# Patient Record
Sex: Male | Born: 1975
Health system: Southern US, Community
[De-identification: ages and names within clinical notes are randomized; demographics above are authoritative.]

## PROBLEM LIST (undated history)

## (undated) DIAGNOSIS — I10 Essential (primary) hypertension: Secondary | ICD-10-CM

## (undated) DIAGNOSIS — E785 Hyperlipidemia, unspecified: Secondary | ICD-10-CM

## (undated) DIAGNOSIS — R011 Cardiac murmur, unspecified: Secondary | ICD-10-CM

## (undated) DIAGNOSIS — F419 Anxiety disorder, unspecified: Secondary | ICD-10-CM

## (undated) DIAGNOSIS — B019 Varicella without complication: Secondary | ICD-10-CM

## (undated) DIAGNOSIS — A770 Spotted fever due to Rickettsia rickettsii: Secondary | ICD-10-CM

## (undated) HISTORY — PX: TYMPANOSTOMY TUBE PLACEMENT: SHX32

## (undated) HISTORY — DX: Anxiety disorder, unspecified: F41.9

## (undated) HISTORY — DX: Hyperlipidemia, unspecified: E78.5

## (undated) HISTORY — DX: Spotted fever due to Rickettsia rickettsii: A77.0

## (undated) HISTORY — DX: Essential (primary) hypertension: I10

## (undated) HISTORY — DX: Cardiac murmur, unspecified: R01.1

## (undated) HISTORY — PX: NO PAST SURGERIES: SHX2092

## (undated) HISTORY — DX: Varicella without complication: B01.9

---

## 2001-04-12 ENCOUNTER — Emergency Department (HOSPITAL_COMMUNITY): Admission: EM | Admit: 2001-04-12 | Discharge: 2001-04-12 | Payer: Self-pay | Admitting: Emergency Medicine

## 2001-04-12 ENCOUNTER — Encounter: Payer: Self-pay | Admitting: Emergency Medicine

## 2008-09-08 ENCOUNTER — Emergency Department: Payer: Self-pay | Admitting: Emergency Medicine

## 2011-12-16 ENCOUNTER — Ambulatory Visit: Payer: Self-pay | Admitting: Orthopedic Surgery

## 2012-01-05 ENCOUNTER — Ambulatory Visit: Payer: Self-pay | Admitting: Pain Medicine

## 2012-07-06 ENCOUNTER — Ambulatory Visit: Payer: Self-pay | Admitting: Pain Medicine

## 2015-03-22 ENCOUNTER — Telehealth: Payer: Self-pay | Admitting: Family Medicine

## 2015-03-22 NOTE — Telephone Encounter (Signed)
Pt came in and dropped off new pt paperwork.. Placed in Dr. Geralynn Ochs box.. Pt appt is 03/28/15 @ 1:30

## 2015-03-25 NOTE — Telephone Encounter (Signed)
Was received and documented in chart.

## 2015-03-28 ENCOUNTER — Ambulatory Visit: Payer: Self-pay | Admitting: Family Medicine

## 2015-07-13 ENCOUNTER — Emergency Department: Payer: BLUE CROSS/BLUE SHIELD

## 2015-07-13 ENCOUNTER — Inpatient Hospital Stay
Admission: EM | Admit: 2015-07-13 | Discharge: 2015-07-14 | DRG: 603 | Disposition: A | Discharge status: Home / Self Care | Payer: BLUE CROSS/BLUE SHIELD | Attending: Internal Medicine | Admitting: Internal Medicine

## 2015-07-13 ENCOUNTER — Encounter: Payer: Self-pay | Admitting: Emergency Medicine

## 2015-07-13 DIAGNOSIS — S40861A Insect bite (nonvenomous) of right upper arm, initial encounter: Secondary | ICD-10-CM | POA: Diagnosis present

## 2015-07-13 DIAGNOSIS — L03113 Cellulitis of right upper limb: Secondary | ICD-10-CM | POA: Diagnosis present

## 2015-07-13 DIAGNOSIS — Z8249 Family history of ischemic heart disease and other diseases of the circulatory system: Secondary | ICD-10-CM | POA: Diagnosis not present

## 2015-07-13 DIAGNOSIS — W57XXXA Bitten or stung by nonvenomous insect and other nonvenomous arthropods, initial encounter: Secondary | ICD-10-CM | POA: Diagnosis present

## 2015-07-13 DIAGNOSIS — Z87891 Personal history of nicotine dependence: Secondary | ICD-10-CM | POA: Diagnosis not present

## 2015-07-13 DIAGNOSIS — L0390 Cellulitis, unspecified: Secondary | ICD-10-CM | POA: Diagnosis present

## 2015-07-13 LAB — CBC WITH DIFFERENTIAL/PLATELET
Basophils Absolute: 0 10*3/uL (ref 0–0.1)
Basophils Relative: 1 %
Eosinophils Absolute: 0.1 10*3/uL (ref 0–0.7)
Eosinophils Relative: 1 %
HCT: 43.8 % (ref 40.0–52.0)
Hemoglobin: 15.1 g/dL (ref 13.0–18.0)
Lymphocytes Relative: 13 %
Lymphs Abs: 1.1 10*3/uL (ref 1.0–3.6)
MCH: 30.6 pg (ref 26.0–34.0)
MCHC: 34.5 g/dL (ref 32.0–36.0)
MCV: 88.8 fL (ref 80.0–100.0)
Monocytes Absolute: 0.8 10*3/uL (ref 0.2–1.0)
Monocytes Relative: 10 %
Neutro Abs: 6.3 10*3/uL (ref 1.4–6.5)
Neutrophils Relative %: 75 %
Platelets: 179 10*3/uL (ref 150–440)
RBC: 4.93 MIL/uL (ref 4.40–5.90)
RDW: 13.4 % (ref 11.5–14.5)
WBC: 8.4 10*3/uL (ref 3.8–10.6)

## 2015-07-13 LAB — COMPREHENSIVE METABOLIC PANEL
ALT: 16 U/L — ABNORMAL LOW (ref 17–63)
AST: 17 U/L (ref 15–41)
Albumin: 4.1 g/dL (ref 3.5–5.0)
Alkaline Phosphatase: 58 U/L (ref 38–126)
Anion gap: 8 (ref 5–15)
BUN: 16 mg/dL (ref 6–20)
CO2: 25 mmol/L (ref 22–32)
Calcium: 8.9 mg/dL (ref 8.9–10.3)
Chloride: 102 mmol/L (ref 101–111)
Creatinine, Ser: 1.02 mg/dL (ref 0.61–1.24)
GFR calc Af Amer: >60 mL/min (ref >60)
GFR calc non Af Amer: >60 mL/min (ref >60)
Glucose, Bld: 104 mg/dL — ABNORMAL HIGH (ref 65–99)
Potassium: 4.1 mmol/L (ref 3.5–5.1)
Sodium: 135 mmol/L (ref 135–145)
Total Bilirubin: 0.7 mg/dL (ref 0.3–1.2)
Total Protein: 7.3 g/dL (ref 6.5–8.1)

## 2015-07-13 LAB — TROPONIN I: Troponin I: <0.03 ng/mL (ref <0.031)

## 2015-07-13 MED ORDER — ACETAMINOPHEN 650 MG RE SUPP: 650.0000 mg | Freq: Four times a day (QID) | RECTAL | Status: DC | PRN

## 2015-07-13 MED ORDER — ACETAMINOPHEN 325 MG PO TABS
650.0000 mg | ORAL_TABLET | Freq: Four times a day (QID) | ORAL | Status: DC | PRN
  Administered 2015-07-13 – 2015-07-14 (×3): 650 mg via ORAL
  Filled 2015-07-13 (×3): qty 2

## 2015-07-13 MED ORDER — HYDROCODONE-ACETAMINOPHEN 5-325 MG PO TABS: 1.0000 | ORAL_TABLET | ORAL | Status: DC | PRN

## 2015-07-13 MED ORDER — MORPHINE SULFATE (PF) 2 MG/ML IV SOLN: 1.0000 mg | INTRAVENOUS | Status: DC | PRN

## 2015-07-13 MED ORDER — ENOXAPARIN SODIUM 40 MG/0.4ML ~~LOC~~ SOLN: 40.0000 mg | SUBCUTANEOUS | Status: DC

## 2015-07-13 MED ORDER — CLINDAMYCIN PHOSPHATE 600 MG/50ML IV SOLN
600.0000 mg | Freq: Once | INTRAVENOUS | Status: AC
  Administered 2015-07-13: 600 mg via INTRAVENOUS
  Filled 2015-07-13: qty 50

## 2015-07-13 MED ORDER — DOXYCYCLINE HYCLATE 100 MG PO TABS
100.0000 mg | ORAL_TABLET | Freq: Two times a day (BID) | ORAL | Status: DC
  Administered 2015-07-13 – 2015-07-14 (×2): 100 mg via ORAL
  Filled 2015-07-13 (×2): qty 1

## 2015-07-13 MED ORDER — SODIUM CHLORIDE 0.9 % IV BOLUS (SEPSIS)
1000.0000 mL | Freq: Once | INTRAVENOUS | Status: AC
  Administered 2015-07-13: 1000 mL via INTRAVENOUS

## 2015-07-13 MED ORDER — ONDANSETRON HCL 4 MG/2ML IJ SOLN: 4.0000 mg | Freq: Four times a day (QID) | INTRAMUSCULAR | Status: DC | PRN

## 2015-07-13 MED ORDER — ONDANSETRON HCL 4 MG PO TABS: 4.0000 mg | ORAL_TABLET | Freq: Four times a day (QID) | ORAL | Status: DC | PRN

## 2015-07-13 MED ORDER — CLINDAMYCIN PHOSPHATE 600 MG/50ML IV SOLN
600.0000 mg | Freq: Three times a day (TID) | INTRAVENOUS | Status: DC
  Administered 2015-07-13 – 2015-07-14 (×3): 600 mg via INTRAVENOUS
  Filled 2015-07-13 (×4): qty 50

## 2015-07-13 MED ORDER — IBUPROFEN 600 MG PO TABS
600.0000 mg | ORAL_TABLET | Freq: Once | ORAL | Status: AC
  Administered 2015-07-13: 600 mg via ORAL
  Filled 2015-07-13: qty 1

## 2015-07-13 NOTE — ED Notes (Signed)
During triage states has R sided chest pain especially with inspiration.

## 2015-07-13 NOTE — ED Provider Notes (Signed)
[facility] Emergency Department Provider Note  ____________________________________________   I have reviewed the triage vital signs and the nursing notes.   HISTORY  Chief Complaint Fever    HPI Nathan Fitzgerald is a 40 y.o. 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 He states this area began to become red and inflamed, yesterday he had fevers. He was started on doxycycline yesterday. Patient states his fevers getting worse and he feels generally washed out. He has not had any vomiting he states he has tried to stay hydrated. He had a brief episode of chest discomfort earlier on that right sided tumor that is completely gone. He also plays of a lymph node in the right neck region. He denies any paralysis or significant headache he denies any sore throat dysuria or urinary frequency, he does state that he has had a significant amount of edema in that right arm.    History reviewed. No pertinent past medical history.  There are no active problems to display for this patient.   History reviewed. No pertinent past surgical history.  No current outpatient prescriptions on file.  Allergies Review of patient's allergies indicates no known allergies.  No family history on file.  Social History Social History  Substance Use Topics  . Smoking status: Former Research scientist (life sciences)  . Smokeless tobacco: None  . Alcohol Use: Yes    Review of Systems Constitutional: Positive fever/chills Eyes: No visual changes. ENT: No sore throat. No stiff neck no neck pain Cardiovascular: Denies chest pain. Respiratory: Denies shortness of breath. Gastrointestinal:   no vomiting.  No diarrhea.  No constipation. Genitourinary: Negative for dysuria. Musculoskeletal: Negative lower extremity swelling Skin: Positive for rash. Neurological: Negative for  headaches, focal weakness or numbness. 10-point ROS otherwise negative.  ____________________________________________   PHYSICAL EXAM:  VITAL SIGNS: ED Triage Vitals  Enc Vitals Group     BP 07/13/15 1628 144/82 mmHg     Pulse Rate 07/13/15 1628 81     Resp 07/13/15 1628 20     Temp 07/13/15 1628 102.1 F (38.9 C)     Temp Source 07/13/15 1628 Oral     SpO2 07/13/15 1628 99 %     Weight 07/13/15 1628 203 lb (92.08 kg)     Height 07/13/15 1628 6' (1.829 m)     Head Cir --      Peak Flow --      Pain Score 07/13/15 1629 8     Pain Loc --      Pain Edu? --      Excl. in [place]? --     Constitutional: Alert and oriented. Nontoxic but appears to not feel well Eyes: Conjunctivae are normal. PERRL. EOMI. Head: Atraumatic. Nose: No congestion/rhinnorhea. Mouth/Throat: Mucous membranes are moist.  Oropharynx non-erythematous. Neck: No stridor.   Nontender with no meningismus, there is a small lymph node noted on the right which is tender but not fluctuant or erythematous Cardiovascular: Normal rate, regular rhythm. Grossly normal heart sounds.  Good peripheral circulation. Respiratory: Normal respiratory effort.  No retractions. Lungs CTAB. Abdominal: Soft and nontender. No distention. No guarding no rebound Back:  There is no focal tenderness or step off there is no midline tenderness there are no lesions noted. there is no CVA tenderness Musculoskeletal: No lower extremity tenderness. No joint effusions, no DVT signs  strong distal pulses no edema Neurologic:  Normal speech and language. No gross focal neurologic deficits are appreciated.  Skin:  Very large area of erythema without any fluctuance noted to the right underarm which goes down past the elbow. Psychiatric: Mood and affect are normal. Speech and behavior are normal.  ____________________________________________   LABS (all labs ordered are listed, but only abnormal results are displayed)  Labs Reviewed  COMPREHENSIVE  METABOLIC PANEL - Abnormal; Notable for the following:    Glucose, Bld 104 (*)    ALT 16 (*)    All other components within normal limits  CULTURE, BLOOD (ROUTINE X 2)  CULTURE, BLOOD (ROUTINE X 2)  CBC WITH DIFFERENTIAL/PLATELET  TROPONIN I   ____________________________________________  EKG  I personally interpreted any EKGs ordered by me or triage Rate 66 bpm sinus rhythm no acute ST elevation or depression partial right bundle branch block noted otherwise unremarkable EKG ____________________________________________  RADIOLOGY  I reviewed any imaging ordered by me or triage that were performed during my shift and, if possible, patient and/or family made aware of any abnormal findings. ____________________________________________   PROCEDURES  Procedure(s) performed: None  Critical Care performed: None  ____________________________________________   INITIAL IMPRESSION / ASSESSMENT AND PLAN / ED COURSE  Pertinent labs & imaging results that were available during my care of the patient were reviewed by me and considered in my medical decision making (see chart for details).  Patient with significant cellulitic component to his arm at this time with no obvious abscess but significant high fevers despite antibiotics. We will start him on clindamycin for possible MRSA coverage. This could be a tickborne pathology and Doxy should be continued but I also feel that he needs more extensive coverage given his high fevers. Even though his white count is reassuring I do feel the patient might benefit from inpatient IV antibiotics and further observation. ____________________________________________   FINAL CLINICAL IMPRESSION(S) / ED DIAGNOSES  Final diagnoses:  None      This chart was dictated using voice recognition software.  Despite best efforts to proofread,  errors can occur which can change meaning.     Schuyler Amor, MD 07/13/15 [phone]

## 2015-07-13 NOTE — ED Notes (Signed)
Took tick off of back of R upper arm 4 days ago. Began fevers yesterday, T max 103.4 today. Took tylenol at 12 noon today. Body aches. R upper arm swollen and red. Went to [place] yesterday and began doxycycline. Symptoms are getting worse.

## 2015-07-13 NOTE — H&P (Signed)
[facility] [place] NAME: Nathan Fitzgerald    MR#:  [id number]  DATE OF BIRTH:  [birth year]  DATE OF ADMISSION:  07/13/2015  PRIMARY CARE PHYSICIAN: Coral Spikes, DO   REQUESTING/REFERRING PHYSICIAN:Dr Mcshane  CHIEF COMPLAINT:   Right UE cellulitis. Tick bite on right arm on tuesday HISTORY OF PRESENT ILLNESS:  Nathan Fitzgerald  is a 40 y.o. male with no PMH comes in the the Er with fever 102 and right arm redness since Tuesday after tick bite. Was started on doxycycline by urgent care.  Received IV Clinda in the ER. Will admit for IV abxs for cellulitis  PAST MEDICAL HISTORY:  History reviewed. No pertinent past medical history.  PAST SURGICAL HISTOIRY:  History reviewed. No pertinent past surgical history.  SOCIAL HISTORY:   Social History  Substance Use Topics  . Smoking status: Former Research scientist (life sciences)  . Smokeless tobacco: Not on file  . Alcohol Use: Yes    FAMILY HISTORY:  HTn  DRUG ALLERGIES:  No Known Allergies  REVIEW OF SYSTEMS:  Review of Systems  Constitutional: Negative for fever, chills and diaphoresis.  HENT: Negative for congestion, ear pain, hearing loss, nosebleeds and sore throat.   Eyes: Negative for blurred vision, double vision, photophobia and pain.  Respiratory: Negative for hemoptysis, sputum production, wheezing and stridor.   Cardiovascular: Negative for orthopnea, claudication and leg swelling.  Gastrointestinal: Negative for heartburn and abdominal pain.  Genitourinary: Negative for dysuria and frequency.  Musculoskeletal: Negative for back pain, joint pain and neck pain.  Skin: Positive for rash.  Neurological: Negative for tingling, sensory change, speech change, focal weakness, seizures and headaches.  Endo/Heme/Allergies: Does not bruise/bleed easily.  Psychiatric/Behavioral: Negative for suicidal ideas, memory loss and substance abuse. The patient is not nervous/anxious.   All other systems  reviewed and are negative.    MEDICATIONS AT HOME:   Prior to Admission medications   Medication Sig Start Date End Date Taking? Authorizing Provider  acetaminophen (TYLENOL) 500 MG tablet Take 500 mg by mouth every 6 (six) hours as needed for mild pain, moderate pain or fever.   Yes Historical Provider, MD  doxycycline (VIBRA-TABS) 100 MG tablet Take 100 mg by mouth 2 (two) times daily. Take for 10 days. 07/12/15  Yes Historical Provider, MD      VITAL SIGNS:  Blood pressure 122/72, pulse 64, temperature 102.1 F (38.9 C), temperature source Oral, resp. rate 18, height 6' (1.829 m), weight 92.08 kg (203 lb), SpO2 94 %.  PHYSICAL EXAMINATION:  GENERAL:  40 y.o.-year-old patient lying in the bed with no acute distress.  EYES: Pupils equal, round, reactive to light and accommodation. No scleral icterus. Extraocular muscles intact.  HEENT: Head atraumatic, normocephalic. Oropharynx and nasopharynx clear.  NECK:  Supple, no jugular venous distention. No thyroid enlargement, no tenderness.  LUNGS: Normal breath sounds bilaterally, no wheezing, rales,rhonchi or crepitation. No use of accessory muscles of respiration.  CARDIOVASCULAR: S1, S2 normal. No murmurs, rubs, or gallops.  ABDOMEN: Soft, nontender, nondistended. Bowel sounds present. No organomegaly or mass.  EXTREMITIES: No pedal edema, cyanosis, or clubbing. Right UE swelling and rash with redenss NEUROLOGIC: Cranial nerves II through XII are intact. Muscle strength 5/5 in all extremities. Sensation intact. Gait not checked.  PSYCHIATRIC: The patient is alert and oriented x 3.  SKIN: No obvious rash, lesion, or ulcer.   LABORATORY PANEL:   CBC  Recent Labs Lab 07/13/15 1650  WBC 8.4  HGB 15.1  HCT 43.8  PLT 179   ------------------------------------------------------------------------------------------------------------------  Chemistries   Recent Labs Lab 07/13/15 1650  NA 135  K 4.1  CL 102  CO2 25  GLUCOSE 104*   BUN 16  CREATININE 1.02  CALCIUM 8.9  AST 17  ALT 16*  ALKPHOS 58  BILITOT 0.7   ------------------------------------------------------------------------------------------------------------------  Cardiac Enzymes  Recent Labs Lab 07/13/15 1650  TROPONINI <0.03   ------------------------------------------------------------------------------------------------------------------  RADIOLOGY:  Dg Chest 2 View  07/13/2015  CLINICAL DATA:  Pain with inspiration. Fevers. Recent tick bite. Ex-smoker. EXAM: CHEST  2 VIEW COMPARISON:  None. FINDINGS: Midline trachea. No pleural effusion or pneumothorax. Diffuse peribronchial thickening. Clear lungs. IMPRESSION: 1.  No acute cardiopulmonary disease. 2. Mild peribronchial thickening which may relate to chronic bronchitis or smoking. Electronically Signed   By: Abigail Miyamoto M.D.   On: 07/13/2015 17:04   IMPRESSION AND PLAN:   Nathan Fitzgerald  is a 40 y.o. male with no PMH comes in the the Er with fever 102 and right arm redness since Tuesday after tick bite. Was started on doxycycline by urgent care.   1. Right UE cellulitis after tick bite IV clinda(to cover MRSA)  Cont doxycycline BC x 2  2. DVT prophylaxis Lovenox SQ   All the records are reviewed and case discussed with ED provider. Management plans discussed with the patient, family and they are in agreement.  CODE STATUS: full  TOTAL TIME TAKING CARE OF THIS PATIENT: 45 minutes.    Nathan Fitzgerald M.D on 07/13/2015 at 7:08 PM  Between 7am to 6pm - Pager - [phone]  After 6pm go to [email or web address] - password [email or web address] [facility]  Office  [phone]  CC: Primary care physician; Coral Spikes, DO

## 2015-07-14 NOTE — Progress Notes (Signed)
Patient ID: Nathan Fitzgerald, male   DOB: 08-Oct-1975, 40 y.o.   MRN: [id number] [name] [place] was admitted to the Hospital on 07/13/2015 and Discharged  07/14/2015 and should be excused from work/school   for 6 days starting 07/13/2015 , may return to work/school without any restrictions.  Loletha Grayer M.D on 07/14/2015,at 10:30 AM  [name] [place]

## 2015-07-14 NOTE — Discharge Summary (Signed)
Discharge summary [facility] at [place] NAME: Nathan Fitzgerald    MR#:  [id number]  DATE OF BIRTH:  1975-07-09  DATE OF ADMISSION:  07/13/2015 ADMITTING PHYSICIAN: Fritzi Mandes, MD  DATE OF DISCHARGE: 07/14/2015  1:48 PM  PRIMARY CARE PHYSICIAN: Coral Spikes, DO    ADMISSION DIAGNOSIS:  Cellulitis of right upper extremity [id number]  DISCHARGE DIAGNOSIS:  Active Problems:   Cellulitis   SECONDARY DIAGNOSIS:  History reviewed. No pertinent past medical history.  HOSPITAL COURSE:   1. Tick bite and cellulitis surrounding the area of the bite on the right inner arm. The patient was having fever at home. Erythema started as a spot and then spread through his inner arm. He states that the erythema has better than yesterday. The area is still warm but faded since yesterday. The patient does not want to stay in the hospital any further and wants to go home. I offered him to stay in the hospital 1 more night and he did not want to do so. I convinced him to stay for 1 more dose of IV antibiotic while here. He will go back on his oral doxycycline upon going home. Note for out of work written in computer  DISCHARGE CONDITIONS:   Satisfactory  CONSULTS OBTAINED:    DnoneRUG ALLERGIES:  No Known Allergies  DISCHARGE MEDICATIONS:   Discharge Medication List as of 07/14/2015 12:13 PM    CONTINUE these medications which have NOT CHANGED   Details  acetaminophen (TYLENOL) 500 MG tablet Take 500 mg by mouth every 6 (six) hours as needed for mild pain, moderate pain or fever., Until Discontinued, Historical Med    doxycycline (VIBRA-TABS) 100 MG tablet Take 100 mg by mouth 2 (two) times daily. Take for 10 days., Starting 07/12/2015, Until Discontinued, Historical Med         DISCHARGE INSTRUCTIONS:   Follow up PMD one week  If you experience worsening of your admission symptoms, develop shortness of breath, life threatening emergency, suicidal or  homicidal thoughts you must seek medical attention immediately by calling 911 or calling your MD immediately  if symptoms less severe.  You Must read complete instructions/literature along with all the possible adverse reactions/side effects for all the Medicines you take and that have been prescribed to you. Take any new Medicines after you have completely understood and accept all the possible adverse reactions/side effects.   Please note  You were cared for by a hospitalist during your hospital stay. If you have any questions about your discharge medications or the care you received while you were in the hospital after you are discharged, you can call the unit and asked to speak with the hospitalist on call if the hospitalist that took care of you is not available. Once you are discharged, your primary care physician will handle any further medical issues. Please note that NO REFILLS for any discharge medications will be authorized once you are discharged, as it is imperative that you return to your primary care physician (or establish a relationship with a primary care physician if you do not have one) for your aftercare needs so that they can reassess your need for medications and monitor your lab values.    Today   CHIEF COMPLAINT:   Chief Complaint  Patient presents with  . Fever    HISTORY OF PRESENT ILLNESS:  Savas Sugarman  is a 40 y.o. male Presents after fever. Had a tick bite and was started  on doxycycline on Friday. Surrounding erythema on the arm worsened in the meantime.   VITAL SIGNS:  Blood pressure 119/62, pulse 69, temperature 99.8 F (37.7 C), temperature source Oral, resp. rate 16, height 6' (1.829 m), weight 94.983 kg (209 lb 6.4 oz), SpO2 99 %.   PHYSICAL EXAMINATION:  GENERAL:  40 y.o.-year-old patient lying in the bed with no acute distress.  EYES: Pupils equal, round, reactive to light and accommodation. No scleral icterus. Extraocular muscles intact.  HEENT:  Head atraumatic, normocephalic. Oropharynx and nasopharynx clear. Positive lymphadenopathy anterior cervical chain on the right. NECK:  Supple, no jugular venous distention. No thyroid enlargement, no tenderness.  LUNGS: Normal breath sounds bilaterally, no wheezing, rales,rhonchi or crepitation. No use of accessory muscles of respiration.  CARDIOVASCULAR: S1, S2 normal. No murmurs, rubs, or gallops.  ABDOMEN: Soft, non-tender, non-distended. Bowel sounds present. No organomegaly or mass.  EXTREMITIES: No pedal edema, cyanosis, or clubbing.  NEUROLOGIC: Cranial nerves II through XII are intact. Muscle strength 5/5 in all extremities. Sensation intact. Gait not checked.  PSYCHIATRIC: The patient is alert and oriented x 3.  SKIN: Outlined cellulitis with pen on the inner right arm showed that the erythema has regressed and faded quite a bit still some erythema at the site of the bite. Skin still warm to the touch. No right axilla lymph nodes.    DATA REVIEW:   CBC  Recent Labs Lab 07/13/15 1650  WBC 8.4  HGB 15.1  HCT 43.8  PLT 179    Chemistries   Recent Labs Lab 07/13/15 1650  NA 135  K 4.1  CL 102  CO2 25  GLUCOSE 104*  BUN 16  CREATININE 1.02  CALCIUM 8.9  AST 17  ALT 16*  ALKPHOS 58  BILITOT 0.7    Cardiac Enzymes  Recent Labs Lab 07/13/15 1650  TROPONINI <0.03    Microbiology Results  Results for orders placed or performed during the hospital encounter of 07/13/15  Culture, blood (routine x 2)     Status: None (Preliminary result)   Collection Time: 07/13/15  6:29 PM  Result Value Ref Range Status   Specimen Description BLOOD LEFT ASSIST CONTROL  Final   Special Requests BOTTLES DRAWN AEROBIC AND ANAEROBIC 10 CC  Final   Culture NO GROWTH < 24 HOURS  Final   Report Status PENDING  Incomplete  Culture, blood (routine x 2)     Status: None (Preliminary result)   Collection Time: 07/13/15  6:29 PM  Result Value Ref Range Status   Specimen Description  BLOOD LEFT HAND  Final   Special Requests BOTTLES DRAWN AEROBIC AND ANAEROBIC 8 CC  Final   Culture NO GROWTH < 24 HOURS  Final   Report Status PENDING  Incomplete    RADIOLOGY:  Dg Chest 2 View  07/13/2015  CLINICAL DATA:  Pain with inspiration. Fevers. Recent tick bite. Ex-smoker. EXAM: CHEST  2 VIEW COMPARISON:  None. FINDINGS: Midline trachea. No pleural effusion or pneumothorax. Diffuse peribronchial thickening. Clear lungs. IMPRESSION: 1.  No acute cardiopulmonary disease. 2. Mild peribronchial thickening which may relate to chronic bronchitis or smoking. Electronically Signed   By: Abigail Miyamoto M.D.   On: 07/13/2015 17:04    Management plans discussed with the patient, family and they are in agreement.  CODE STATUS:     Code Status Orders        Start     Ordered   07/13/15 2012  Full code   Continuous  07/13/15 2011    Code Status History    Date Active Date Inactive Code Status Order ID Comments User Context   This patient has a current code status but no historical code status.      TOTAL TIME TAKING CARE OF THIS PATIENT: 32 minutes.    Loletha Grayer M.D on 07/14/2015 at 3:26 PM  Between 7am to 6pm - Pager - [phone]  After 6pm go to [email or web address] - Proofreader  [employer] [facility] Office  [phone]  CC: Primary care physician; Coral Spikes, DO

## 2015-07-14 NOTE — Discharge Instructions (Signed)

## 2015-07-18 ENCOUNTER — Encounter: Payer: Self-pay | Admitting: Family Medicine

## 2015-07-18 ENCOUNTER — Ambulatory Visit (INDEPENDENT_AMBULATORY_CARE_PROVIDER_SITE_OTHER): Payer: BLUE CROSS/BLUE SHIELD | Admitting: Family Medicine

## 2015-07-18 VITALS — BP 118/84 | HR 89 | Temp 100.7°F | Ht 72.0 in | Wt 198.1 lb

## 2015-07-18 DIAGNOSIS — R509 Fever, unspecified: Secondary | ICD-10-CM | POA: Diagnosis not present

## 2015-07-18 DIAGNOSIS — J029 Acute pharyngitis, unspecified: Secondary | ICD-10-CM

## 2015-07-18 LAB — POCT RAPID STREP A (OFFICE): Rapid Strep A Screen: NEGATIVE

## 2015-07-18 MED ORDER — CEFDINIR 300 MG PO CAPS: 300.0000 mg | ORAL_CAPSULE | Freq: Two times a day (BID) | ORAL | Status: DC

## 2015-07-18 MED ORDER — CEFTRIAXONE SODIUM 1 G IJ SOLR
1.0000 g | Freq: Once | INTRAMUSCULAR | Status: AC
  Administered 2015-07-18: 1 g via INTRAMUSCULAR

## 2015-07-18 NOTE — Patient Instructions (Signed)
Take the medication as prescribed.  Call/follow up if you worsen.  Take care  Dr. Lacinda Axon

## 2015-07-19 ENCOUNTER — Encounter: Payer: Self-pay | Admitting: Family Medicine

## 2015-07-19 ENCOUNTER — Telehealth: Payer: Self-pay | Admitting: Family Medicine

## 2015-07-19 DIAGNOSIS — R509 Fever, unspecified: Secondary | ICD-10-CM | POA: Insufficient documentation

## 2015-07-19 DIAGNOSIS — J029 Acute pharyngitis, unspecified: Secondary | ICD-10-CM | POA: Insufficient documentation

## 2015-07-19 NOTE — Assessment & Plan Note (Signed)
New problem. Concerning given recent tick bite and cardiac exam. Recent blood cultures were negative. Unclear if this is from tick borne illness or pharyngitis. Advised to continue doxycycline.

## 2015-07-19 NOTE — Assessment & Plan Note (Signed)
New acute problem. Rapid strep negative today. Sending for culture. Given persistent fever and severity of pharyngitis, treating with Omnicef. Patient requested medication to expedite improvement. Given the above, IM Rocephin was given today.

## 2015-07-19 NOTE — Telephone Encounter (Signed)
Nathan Fitzgerald said that  Dr. Lacinda Axon wanted to know how pt was doing today (07/19/15).  Pt is still running a high fever (102.6), and still has the chills, and sore throat.

## 2015-07-19 NOTE — Telephone Encounter (Signed)
Patient stated that fever is up and down. He feels okay when its at (100), but when it spikes he does not feel well. Right side of his throat is better the left side is still swollen.

## 2015-07-19 NOTE — Progress Notes (Signed)
Subjective:  Patient ID: Nathan Fitzgerald, male    DOB: Oct 15, 1975  Age: 40 y.o. MRN: [id number]  CC: Fever, ST  HPI Nathan Fitzgerald is a 40 y.o. male presents to the clinic today with the above complaints.  Patient was recently admitted on 5/27-5/28 for cellulitis of the right upper arm following a tick bite. Prior to getting admitted he was treated with doxycycline as an outpatient (went to urgent care). His cellulitis worsened prompting him to go to the hospital. He was treated in the hospital with IV clindamycin and was discharged home on doxycycline. Patient states that since he's been home he's had daily fever. He reports associated fatigue. As of Wednesday he developed severe sore throat and swollen lymph nodes in anterior neck. He endorses compliance with doxycycline. No other associated symptoms. Patient states that his sore throat is interfering with his ability to eat and drink. He states the Tylenol with no improvement. No other complaints at this time.  PMH, Surgical Hx, Family Hx, Social History reviewed and updated as below.  Past Medical History  Diagnosis Date  . Chicken pox    Past Surgical History  Procedure Laterality Date  . No past surgeries     Family History  Problem Relation Age of Onset  . Adopted: Yes  . Family history unknown: Yes   Social History  Substance Use Topics  . Smoking status: Former Research scientist (life sciences)  . Smokeless tobacco: Never Used  . Alcohol Use: 3.0 oz/week    5 Standard drinks or equivalent per week   Review of Systems  Constitutional: Positive for fever and fatigue.  HENT: Positive for sore throat.   Skin: Positive for rash.  All other systems reviewed and are negative.  Objective:   Today's Vitals: BP 118/84 mmHg  Pulse 89  Temp(Src) 100.7 F (38.2 C) (Oral)  Ht 6' (1.829 m)  Wt 198 lb 2 oz (89.869 kg)  BMI 26.86 kg/m2  SpO2 98%  Physical Exam  Constitutional: He is oriented to person, place, and time. He appears well-developed. No  distress.  HENT:  Head: Normocephalic and atraumatic.  Oropharynx with severe erythema. No exudate.  Eyes: Conjunctivae are normal.  Neck: Neck supple.  Anterior cervical lymphadenopathy with tenderness.  Cardiovascular: Normal rate and regular rhythm.   ? Gallop or split S2. Soft systolic murmur.  Pulmonary/Chest: Effort normal. He has no wheezes. He has no rales.  Abdominal: Soft. He exhibits no distension. There is no tenderness. There is no rebound and no guarding.  Musculoskeletal: Normal range of motion.  Neurological: He is alert and oriented to person, place, and time.  Skin:  Erythematous papular rash noted on the medial aspects of the upper arms. Area of previous tick bite appears well-healed and normal.  Psychiatric: He has a normal mood and affect.  Vitals reviewed.  Assessment & Plan:   Problem List Items Addressed This Visit    Acute pharyngitis    New acute problem. Rapid strep negative today. Sending for culture. Given persistent fever and severity of pharyngitis, treating with Omnicef. Patient requested medication to expedite improvement. Given the above, IM Rocephin was given today.      Relevant Orders   POCT rapid strep A (Completed)   Culture, Group A Strep   Fever, unspecified - Primary    New problem. Concerning given recent tick bite and cardiac exam. Recent blood cultures were negative. Unclear if this is from tick borne illness or pharyngitis. Advised to continue doxycycline.  Relevant Medications   cefTRIAXone (ROCEPHIN) injection 1 g (Completed)   Other Relevant Orders   POCT rapid strep A (Completed)   Culture, Group A Strep      Outpatient Encounter Prescriptions as of 07/18/2015  Medication Sig  . acetaminophen (TYLENOL) 500 MG tablet Take 500 mg by mouth every 6 (six) hours as needed for mild pain, moderate pain or fever.  . doxycycline (VIBRA-TABS) 100 MG tablet Take 100 mg by mouth 2 (two) times daily. Take for 10 days.  .  cefdinir (OMNICEF) 300 MG capsule Take 1 capsule (300 mg total) by mouth 2 (two) times daily.  . [EXPIRED] cefTRIAXone (ROCEPHIN) injection 1 g    No facility-administered encounter medications on file as of 07/18/2015.   Follow-up: PRN  Nathan Fitzgerald

## 2015-07-19 NOTE — Telephone Encounter (Signed)
Continue medication. If fever persists, he should go to the hospital for eval over the weekend.

## 2015-07-20 ENCOUNTER — Encounter: Payer: Self-pay | Admitting: Family Medicine

## 2015-07-20 LAB — CULTURE, GROUP A STREP: Organism ID, Bacteria: NORMAL

## 2015-07-21 ENCOUNTER — Ambulatory Visit
Admission: EM | Admit: 2015-07-21 | Discharge: 2015-07-21 | Disposition: A | Discharge status: Home / Self Care | Payer: BLUE CROSS/BLUE SHIELD | Attending: Family Medicine | Admitting: Family Medicine

## 2015-07-21 DIAGNOSIS — R509 Fever, unspecified: Secondary | ICD-10-CM | POA: Diagnosis not present

## 2015-07-21 LAB — URINALYSIS COMPLETE WITH MICROSCOPIC (ARMC ONLY)
Bacteria, UA: NONE SEEN
Glucose, UA: NEGATIVE mg/dL
Hgb urine dipstick: NEGATIVE
Leukocytes, UA: NEGATIVE
Nitrite: NEGATIVE
Protein, ur: 30 mg/dL — AB
RBC / HPF: NONE SEEN RBC/hpf (ref 0–5)
Specific Gravity, Urine: 1.02 (ref 1.005–1.030)
pH: 6 (ref 5.0–8.0)

## 2015-07-21 LAB — CBC WITH DIFFERENTIAL/PLATELET
Basophils Absolute: 0.1 10*3/uL (ref 0–0.1)
Basophils Relative: 1 %
Eosinophils Absolute: 0.2 10*3/uL (ref 0–0.7)
Eosinophils Relative: 1 %
HCT: 46.5 % (ref 40.0–52.0)
Hemoglobin: 15.6 g/dL (ref 13.0–18.0)
Lymphocytes Relative: 11 %
Lymphs Abs: 1.5 10*3/uL (ref 1.0–3.6)
MCH: 29.8 pg (ref 26.0–34.0)
MCHC: 33.6 g/dL (ref 32.0–36.0)
MCV: 88.7 fL (ref 80.0–100.0)
Monocytes Absolute: 1.1 10*3/uL — ABNORMAL HIGH (ref 0.2–1.0)
Monocytes Relative: 8 %
Neutro Abs: 10.8 10*3/uL — ABNORMAL HIGH (ref 1.4–6.5)
Neutrophils Relative %: 79 %
Platelets: 338 10*3/uL (ref 150–440)
RBC: 5.25 MIL/uL (ref 4.40–5.90)
RDW: 13.6 % (ref 11.5–14.5)
WBC: 13.6 10*3/uL — ABNORMAL HIGH (ref 3.8–10.6)

## 2015-07-21 LAB — MONONUCLEOSIS SCREEN: Mono Screen: NEGATIVE

## 2015-07-21 NOTE — Discharge Instructions (Signed)
Call Dr. Lacinda Axon first in the morning for an appointment. If unable to keep down fluids, unable to keep fever below 102, or significant pain or shortness of breath you need to go immediately to the emergency room  Fever, Adult A fever is an increase in the body's temperature. It is often defined as a temperature of 100 F (38C) or higher. Short mild or moderate fevers often have no long-term effects. They also often do not need treatment. Moderate or high fevers may make you feel uncomfortable. Sometimes, they can also be a sign of a serious illness or disease. The sweating that may happen with repeated fevers or fevers that last a while may also cause you to not have enough fluid in your body (dehydration). You can take your temperature with a thermometer to see if you have a fever. A measured temperature can change with:  Age.  Time of day.  Where the thermometer is placed:  Mouth (oral).  Rectum (rectal).  Ear (tympanic).  Underarm (axillary).  Forehead (temporal). HOME CARE Pay attention to any changes in your symptoms. Take these actions to help with your condition:  Take over-the-counter and prescription medicines only as told by your doctor. Follow the dosing instructions carefully.  If you were prescribed an antibiotic medicine, take it as told by your doctor. Do not stop taking the antibiotic even if you start to feel better.  Rest as needed.  Drink enough fluid to keep your pee (urine) clear or pale yellow.  Sponge yourself or bathe with room-temperature water as needed. This helps to lower your body temperature . Do not use ice water.  Do not wear too many blankets or heavy clothes. GET HELP IF:  You throw up (vomit).  You cannot eat or drink without throwing up.  You have watery poop (diarrhea).  It hurts when you pee.  Your symptoms do not get better with treatment.  You have new symptoms.  You feel very weak. GET HELP RIGHT AWAY IF:  You are short of  breath or have trouble breathing.  You are dizzy or you pass out (faint).  You feel confused.  You have signs of not having enough fluid in your body, such as:  A dry mouth.  Peeing less.  Looking pale.  You have very bad pain in your belly (abdomen).  You keep throwing up or having water poop.  You have a skin rash.  Your symptoms suddenly get worse.   This information is not intended to replace advice given to you by your health care provider. Make sure you discuss any questions you have with your health care provider.   Document Released: 11/12/2007 Document Revised: 10/24/2014 Document Reviewed: 03/29/2014 Elsevier Interactive Patient Education [employer].

## 2015-07-21 NOTE — ED Provider Notes (Signed)
CSN: [id number]     Arrival date & time 07/21/15  0818 History   First MD Initiated Contact with Patient 07/21/15 [phone]     Chief Complaint  Patient presents with  . Fever    Pt reports recurrent fevers and sore throat from tick bite/cellulitis. He is being treated with Doxycyline and was hospitalized for cellulitiis and received IV ABX there. Blood cultures were done. F/u with Dr. Lacinda Axon after discharge and had negative strep test done. "Just not improving"   HPI   Nathan Fitzgerald is a pleasant 40 y.o. male who presents with fever for past 2 weeks with his husband. He admits to tick bite in his right upper arm and back about 2 weeks ago. The following day he developed a red painful rash on his right upper arm. He said it was painful and hot to touch. He was seen and treated with doxycycline and just completed treatment. In the interim he was hospitalized for cellulitis. Blood cultures returned negative. He denies any further rash. A couple days ago he developed right anterior cervical nodes and sore throat and was seen by Dr. Lacinda Axon his PCP. He had a negative throat culture at that time. He is continue to have daily fevers. He is taking around-the-clock Tylenol 2 extra strength for Motrin every 4-6 hours. His temperature max was 101 on medications and 103 when he was hospitalized. He was treated with clindamycin in the hospital. 3 days ago he was started on Cefdinir & given IM rocephin.  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 He denies any abdominal pain or urinary symptoms.  He denies any known history of tickborne illness or mono.  He denies any history of HIV.  He has diaphoresis and night sweats.      Past Medical History  Diagnosis Date  . Chicken pox    Past Surgical History  Procedure Laterality Date  . No past surgeries     Family History  Problem Relation Age of Onset  . Adopted: Yes  . Family history unknown: Yes   Social History  Substance Use  Topics  . Smoking status: Former Research scientist (life sciences)  . Smokeless tobacco: Never Used  . Alcohol Use: 3.0 oz/week    5 Standard drinks or equivalent per week    Review of Systems  Constitutional: Positive for fever, chills, diaphoresis, activity change, appetite change and fatigue.  HENT: Positive for congestion, ear pain and sore throat. Negative for mouth sores, postnasal drip, rhinorrhea, sinus pressure, sneezing and trouble swallowing.   Eyes: Negative.   Respiratory: Negative.   Cardiovascular: Negative.   Gastrointestinal: Negative.   Genitourinary: Negative.   Musculoskeletal: Negative.   Skin: Negative.   Neurological: Negative.   Psychiatric/Behavioral: Negative.     Allergies  Review of patient's allergies indicates no known allergies.  Home Medications   Prior to Admission medications   Medication Sig Start Date End Date Taking? Authorizing Provider  acetaminophen (TYLENOL) 500 MG tablet Take 500 mg by mouth every 6 (six) hours as needed for mild pain, moderate pain or fever.    Historical Provider, MD  cefdinir (OMNICEF) 300 MG capsule Take 1 capsule (300 mg total) by mouth 2 (two) times daily. 07/18/15   Coral Spikes, DO  doxycycline (VIBRA-TABS) 100 MG tablet Take 100 mg by mouth 2 (two) times daily. Take for 10 days. 07/12/15   Historical Provider, MD   Meds Ordered and Administered this  Visit  Medications - No data to display  BP 129/77 mmHg  Pulse 88  Temp(Src) 98.6 F (37 C) (Oral)  Resp 18  Ht 6' (1.829 m)  Wt 193 lb (87.544 kg)  BMI 26.17 kg/m2  SpO2 100% No data found.   Physical Exam  Constitutional: He is oriented to person, place, and time. He appears well-developed and well-nourished. No distress.  HENT:  Head: Normocephalic and atraumatic.  Right Ear: Tympanic membrane, external ear and ear canal normal. Tympanic membrane is not injected, not erythematous and not bulging.  Left Ear: Hearing, tympanic membrane, external ear and ear canal normal. Tympanic  membrane is not injected, not erythematous and not bulging.  Nose: Nose normal. Right sinus exhibits no maxillary sinus tenderness and no frontal sinus tenderness. Left sinus exhibits no maxillary sinus tenderness and no frontal sinus tenderness.  Mouth/Throat: Uvula is midline and mucous membranes are normal. Posterior oropharyngeal erythema present. No oropharyngeal exudate, posterior oropharyngeal edema or tonsillar abscesses.  Mouth posterior pharyngeal and soft palate erythema with a few tiny petechiae  Eyes: Conjunctivae are normal. Right eye exhibits no discharge. Left eye exhibits no discharge. No scleral icterus.  Neck: Trachea normal and normal range of motion. Neck supple. No thyroid mass and no thyromegaly present.  Cardiovascular: Normal rate, regular rhythm and normal heart sounds.   Pulmonary/Chest: Effort normal and breath sounds normal. No respiratory distress.  Abdominal: Soft. Bowel sounds are normal. He exhibits no distension. There is no hepatosplenomegaly. There is no tenderness. There is no rebound.  Musculoskeletal: Normal range of motion. He exhibits no edema or tenderness.  Lymphadenopathy:    He has no cervical adenopathy.    He has no axillary adenopathy.  Neurological: He is alert and oriented to person, place, and time. No cranial nerve deficit.  Skin: Skin is warm. No rash noted. He is diaphoretic. No cyanosis or erythema. Nails show no clubbing.  Psychiatric: His behavior is normal. Judgment normal.  Nursing note and vitals reviewed.   ED Course  Procedures NA  Labs Review Labs Reviewed  CBC WITH DIFFERENTIAL/PLATELET - Abnormal; Notable for the following:    WBC 13.6 (*)    Neutro Abs 10.8 (*)    Monocytes Absolute 1.1 (*)    All other components within normal limits  URINALYSIS COMPLETEWITH MICROSCOPIC ([facility] ONLY) - Abnormal; Notable for the following:    Bilirubin Urine 1+ (*)    Ketones, ur TRACE (*)    Protein, ur 30 (*)    Squamous Epithelial /  LPF 0-5 (*)    All other components within normal limits  MONONUCLEOSIS SCREEN  ROCKY MTN SPOTTED FVR ABS PNL(IGG+IGM)  B. BURGDORFI ANTIBODIES  HIV ANTIBODY (ROUTINE TESTING)    Imaging Review No results found.  MDM   1. Recurrent fever of unknown cause    UA, CBC and mono tests reviewed with patient and husband. Etiology of fever unclear at this time. White blood cell count is 13,000. He was advised to follow with Dr. Lacinda Axon first thing in the morning or to go to emergency department for further evaluation if his symptoms worsen prior to then. We will contact him with follow-up labs  Discussed management with Dr Alveta Heimlich.  Plan: Test results and diagnosis reviewed with patient/caregiver  Continue tylenol & ibuprofen PRN Recommend supportive treatment with PUSH fluids & rest Continue omnicef     Andria Meuse, NP 07/21/15 1105

## 2015-07-22 ENCOUNTER — Encounter: Payer: Self-pay | Admitting: Family Medicine

## 2015-07-22 ENCOUNTER — Telehealth: Payer: Self-pay | Admitting: *Deleted

## 2015-07-22 NOTE — Telephone Encounter (Signed)
Please advise he was seen in the ED on 6/5, no indication of timeline for me to go off. thanks

## 2015-07-22 NOTE — Telephone Encounter (Signed)
Patient wanted to verify with Dr. Lacinda Axon when he should return to work.

## 2015-07-22 NOTE — Telephone Encounter (Signed)
FYI

## 2015-07-23 LAB — HIV ANTIBODY (ROUTINE TESTING W REFLEX): HIV Screen 4th Generation wRfx: NONREACTIVE

## 2015-07-23 NOTE — Telephone Encounter (Signed)
May return to work when he likes. I will write letter to reflect preference.

## 2015-07-23 NOTE — Telephone Encounter (Signed)
Letter completed and patient notified.

## 2015-07-23 NOTE — Telephone Encounter (Signed)
Patient would like a return to work note for Wednesday . Please call when note is ready for pick up  [phone]

## 2015-07-24 LAB — ROCKY MTN SPOTTED FVR ABS PNL(IGG+IGM)
RMSF IgG: NEGATIVE
RMSF IgM: 0.31 {index} (ref 0.00–0.89)

## 2015-07-24 LAB — B. BURGDORFI ANTIBODIES: B burgdorferi Ab IgG+IgM: <0.91 {ISR} (ref 0.00–0.90)

## 2015-07-25 LAB — CULTURE, BLOOD (ROUTINE X 2)
Culture: NO GROWTH
Culture: NO GROWTH

## 2015-11-06 ENCOUNTER — Encounter: Payer: Self-pay | Admitting: Family Medicine

## 2016-02-29 ENCOUNTER — Ambulatory Visit
Admission: EM | Admit: 2016-02-29 | Discharge: 2016-02-29 | Disposition: A | Discharge status: Home / Self Care | Payer: Commercial Managed Care - HMO | Attending: Emergency Medicine | Admitting: Emergency Medicine

## 2016-02-29 ENCOUNTER — Encounter: Payer: Self-pay | Admitting: Gynecology

## 2016-02-29 DIAGNOSIS — J101 Influenza due to other identified influenza virus with other respiratory manifestations: Secondary | ICD-10-CM

## 2016-02-29 LAB — RAPID INFLUENZA A&B ANTIGENS
Influenza A (ARMC): POSITIVE — AB
Influenza B (ARMC): NEGATIVE

## 2016-02-29 MED ORDER — OSELTAMIVIR PHOSPHATE 75 MG PO CAPS: 75.0000 mg | ORAL_CAPSULE | Freq: Two times a day (BID) | ORAL | 0 refills | Status: DC

## 2016-02-29 MED ORDER — CETIRIZINE-PSEUDOEPHEDRINE ER 5-120 MG PO TB12: 1.0000 | ORAL_TABLET | Freq: Two times a day (BID) | ORAL | 0 refills | Status: DC

## 2016-02-29 NOTE — ED Triage Notes (Signed)
Patient c/o headache and fever at home of 101.8 x today.

## 2016-02-29 NOTE — ED Provider Notes (Signed)
CSN: [id number]     Arrival date & time 02/29/16  0848 History   First MD Initiated Contact with Patient 02/29/16 [phone]     Chief Complaint  Patient presents with  . Headache  . Fever   (Consider location/radiation/quality/duration/timing/severity/associated sxs/prior Treatment) HPI  This a 41 year old male who started having headache and fever yesterday and subsequently left work early. This morning he awoke with the same symptoms and a fever of 101.8. He has not received his flu shot this year. He said he has very little cough and only slight congestion. Has had  diarrhea. He denies any sore throat or urinary symptoms. He had nausea yesterday afternoon.       Past Medical History:  Diagnosis Date  . Chicken pox    Past Surgical History:  Procedure Laterality Date  . NO PAST SURGERIES     Family History  Problem Relation Age of Onset  . Adopted: Yes  . Family history unknown: Yes   Social History  Substance Use Topics  . Smoking status: Former Research scientist (life sciences)  . Smokeless tobacco: Never Used  . Alcohol use 3.0 oz/week    5 Standard drinks or equivalent per week    Review of Systems  Constitutional: Positive for activity change, fatigue and fever. Negative for chills.  HENT: Positive for congestion and postnasal drip. Negative for sore throat.   Respiratory: Positive for cough. Negative for shortness of breath, wheezing and stridor.   All other systems reviewed and are negative.   Allergies  Patient has no known allergies.  Home Medications   Prior to Admission medications   Medication Sig Start Date End Date Taking? Authorizing Provider  acetaminophen (TYLENOL) 500 MG tablet Take 500 mg by mouth every 6 (six) hours as needed for mild pain, moderate pain or fever.   Yes Historical Provider, MD  cetirizine-pseudoephedrine (ZYRTEC-D) 5-120 MG tablet Take 1 tablet by mouth 2 (two) times daily. 02/29/16   [name], PA-C  oseltamivir (TAMIFLU) 75 MG capsule Take 1  capsule (75 mg total) by mouth every 12 (twelve) hours. 02/29/16   [name], PA-C   Meds Ordered and Administered this Visit  Medications - No data to display  BP 135/79 (BP Location: Left Arm)   Pulse 85   Temp (!) 102.1 F (38.9 C) (Oral)   Resp 16   Wt 205 lb (93 kg)   SpO2 98%   BMI 27.80 kg/m  No data found.   Physical Exam  Constitutional: He is oriented to person, place, and time. He appears well-developed and well-nourished. No distress.  HENT:  Head: Normocephalic and atraumatic.  Right Ear: External ear normal.  Left Ear: External ear normal.  Nose: Nose normal.  Mouth/Throat: Oropharynx is clear and moist. No oropharyngeal exudate.  Eyes: EOM are normal. Pupils are equal, round, and reactive to light. Right eye exhibits no discharge. Left eye exhibits no discharge.  Neck: Normal range of motion. Neck supple.  Pulmonary/Chest: Effort normal and breath sounds normal. No respiratory distress. He has no wheezes. He has no rales.  Musculoskeletal: Normal range of motion.  Lymphadenopathy:    He has no cervical adenopathy.  Neurological: He is alert and oriented to person, place, and time.  Skin: Skin is warm and dry. He is not diaphoretic.  Psychiatric: He has a normal mood and affect. His behavior is normal. Judgment and thought content normal.  Nursing note and vitals reviewed.   Urgent Care Course   Clinical Course  Procedures (including critical care time)  Labs Review Labs Reviewed  RAPID INFLUENZA A&B ANTIGENS ([place]) - Abnormal; Notable for the following:       Result Value   Influenza A ([facility]) POSITIVE (*)    All other components within normal limits    Imaging Review No results found.   Visual Acuity Review  Right Eye Distance:   Left Eye Distance:   Bilateral Distance:    Right Eye Near:   Left Eye Near:    Bilateral Near:         MDM   1. Influenza A    Discharge Medication List as of 02/29/2016 10:05 AM     START taking these medications   Details  cetirizine-pseudoephedrine (ZYRTEC-D) 5-120 MG tablet Take 1 tablet by mouth 2 (two) times daily., Starting Sat 02/29/2016, Normal    oseltamivir (TAMIFLU) 75 MG capsule Take 1 capsule (75 mg total) by mouth every 12 (twelve) hours., Starting Sat 02/29/2016, Normal      Plan: 1. Test/x-ray results and diagnosis reviewed with patient 2. rx as per orders; risks, benefits, potential side effects reviewed with patient 3. Recommend supportive treatment with Rest and fluids use Tylenol Motrin to control body aches and fever. Should stay out of work while running a fever. Follow-up with your primary care physician if you're not improving. 4. F/u prn if symptoms worsen or don't improve     [name], PA-C 02/29/16 1013

## 2016-04-02 ENCOUNTER — Ambulatory Visit
Admission: EM | Admit: 2016-04-02 | Discharge: 2016-04-02 | Disposition: A | Discharge status: Home / Self Care | Payer: Commercial Managed Care - HMO | Attending: Emergency Medicine | Admitting: Emergency Medicine

## 2016-04-02 ENCOUNTER — Ambulatory Visit (INDEPENDENT_AMBULATORY_CARE_PROVIDER_SITE_OTHER): Payer: Commercial Managed Care - HMO

## 2016-04-02 DIAGNOSIS — M545 Low back pain: Secondary | ICD-10-CM | POA: Diagnosis not present

## 2016-04-02 DIAGNOSIS — M5442 Lumbago with sciatica, left side: Secondary | ICD-10-CM

## 2016-04-02 MED ORDER — KETOROLAC TROMETHAMINE 60 MG/2ML IM SOLN: 60.0000 mg | Freq: Once | INTRAMUSCULAR | Status: DC

## 2016-04-02 MED ORDER — DICLOFENAC SODIUM 75 MG PO TBEC: 75.0000 mg | DELAYED_RELEASE_TABLET | Freq: Two times a day (BID) | ORAL | 0 refills | Status: DC

## 2016-04-02 MED ORDER — METHOCARBAMOL 750 MG PO TABS: 750.0000 mg | ORAL_TABLET | ORAL | 0 refills | Status: DC

## 2016-04-02 MED ORDER — KETOROLAC TROMETHAMINE 60 MG/2ML IM SOLN
60.0000 mg | Freq: Once | INTRAMUSCULAR | Status: AC
  Administered 2016-04-02: 60 mg via INTRAMUSCULAR

## 2016-04-02 MED ORDER — TRAMADOL HCL 50 MG PO TABS
50.0000 mg | ORAL_TABLET | Freq: Four times a day (QID) | ORAL | 0 refills | Status: DC | PRN
Start: 2016-04-02 — End: 2016-07-01

## 2016-04-02 MED ORDER — METHYLPREDNISOLONE SODIUM SUCC 125 MG IJ SOLR
125.0000 mg | Freq: Once | INTRAMUSCULAR | Status: AC
  Administered 2016-04-02: 125 mg via INTRAMUSCULAR

## 2016-04-02 MED ORDER — PREDNISONE 10 MG (21) PO TBPK: ORAL_TABLET | ORAL | 0 refills | Status: DC

## 2016-04-02 NOTE — ED Provider Notes (Signed)
HPI  SUBJECTIVE:  Nathan Fitzgerald is a 41 y.o. male who presents with  the acute onset of left-sided and midline low back pain that he describes as throbbing, spasm like and constant since after bending forward to pick up a cardboard box while at work. He states that he has had similar pain before, but states it is more intense this time. He reports nausea from the pain, pins and needles sensation in both of his feet and states that the back pain radiates down his outer left thigh. Symptoms are worse with torso rotation, bending forward, no alleviating factors. He has not tried anything for this. He denies hip pain. He states that this is not  worker's comp.   denies vomiting, fevers, flank pain, abdominal pain, urinary urgency, frequency, dysuria, cloudy or odorous urine, hematuria.  No syncope. No saddle anesthesia, distal weakness, night sweats,  recent h/o trauma,  bladder/ bowel incontinence, urinary retention, h/o CA / multiple myleoma, unexplained weight loss, pain worse at night,  h/o prolonged steroid use, h/o osteopenia, h/o IVDU, h/o HIV, known AAA.  States feels similar but more intense than previous episodes of back pain. no h/o pyelonephritis, nephrolithiasis. Had MRI of L spine several years ago by [facility] found to have bulging disc, cannot remember level. No h/o DM, HTN. PMD: Dr. Thersa Salt   Past Medical History:  Diagnosis Date  . Chicken pox     Past Surgical History:  Procedure Laterality Date  . NO PAST SURGERIES      Family History  Problem Relation Age of Onset  . Adopted: Yes  . Family history unknown: Yes    Social History  Substance Use Topics  . Smoking status: Former Research scientist (life sciences)  . Smokeless tobacco: Never Used  . Alcohol use 3.0 oz/week    5 Standard drinks or equivalent per week    No current facility-administered medications for this encounter.   Current Outpatient Prescriptions:  .  acetaminophen (TYLENOL) 500 MG tablet, Take 500 mg by mouth  every 6 (six) hours as needed for mild pain, moderate pain or fever., Disp: , Rfl:  .  diclofenac (VOLTAREN) 75 MG EC tablet, Take 1 tablet (75 mg total) by mouth 2 (two) times daily. Take with food, Disp: 30 tablet, Rfl: 0 .  methocarbamol (ROBAXIN) 750 MG tablet, Take 1 tablet (750 mg total) by mouth every 4 (four) hours., Disp: 40 tablet, Rfl: 0 .  predniSONE (STERAPRED UNI-PAK 21 TAB) 10 MG (21) TBPK tablet, Dispense one 6 day pack. Take as directed with food., Disp: 21 tablet, Rfl: 0 .  traMADol (ULTRAM) 50 MG tablet, Take 1 tablet (50 mg total) by mouth every 6 (six) hours as needed., Disp: 20 tablet, Rfl: 0  No Known Allergies   ROS  As noted in HPI.   Physical Exam  BP (!) 160/109 (BP Location: Left Arm)   Pulse 89   Temp 98.3 F (36.8 C) (Oral)   Resp 20   Wt 205 lb (93 kg)   SpO2 99%   BMI 27.80 kg/m   Constitutional: Well developed, well nourished,Appears uncomfortable Eyes:  EOMI, conjunctiva normal bilaterally HENT: Normocephalic, atraumatic,mucus membranes moist Respiratory: Normal inspiratory effort Cardiovascular: Normal rate GI: nondistended. No suprapubic, flank tenderness.  normal bowel sounds skin: No rash, skin intact Musculoskeletal: no CVAT.No rash over his back.  -  paralumbar tenderness,  - muscle spasm. + bony tenderness at L5/S1. Bilateral lower extremities nontender  baseline ROM with intact DP pulses. Patient has  difficulty relaxing, exam somewhat limited + pain  aggravated with int/ext rotation flex/extension hipsleft side.. SLR neg bilaterally But aggravates pain in left side.  pain aggravated with active hip flexion bilaterally and hip extension left side . Sensation baseline light touch bilaterally for Pt, DTR's symmetric and intact bilaterally KJ, Motor symmetric bilateral 5/5 hip flexion, quadriceps, hamstrings, EHL, foot dorsiflexion, foot plantarflexion, gait somewhat antalgic but without apparent new ataxia. Neurologic: Alert & oriented x 3, no  focal neuro deficits Psychiatric: Speech and behavior appropriate   ED Course   Medications  ketorolac (TORADOL) injection 60 mg (60 mg Intramuscular Given 04/02/16 1110)  methylPREDNISolone sodium succinate (SOLU-MEDROL) 125 mg/2 mL injection 125 mg (125 mg Intramuscular Given 04/02/16 1156)    Orders Placed This Encounter  Procedures  . DG Lumbar Spine Complete    Standing Status:   Standing    Number of Occurrences:   1    Order Specific Question:   Reason for Exam (SYMPTOM  OR DIAGNOSIS REQUIRED)    Answer:   h/o bulging disc acute LBP r/o acute changes    No results found for this or any previous visit (from the past 24 hour(s)). Dg Lumbar Spine Complete  Result Date: 04/02/2016 CLINICAL DATA:  Low back pain today EXAM: LUMBAR SPINE - COMPLETE 4+ VIEW COMPARISON:  MRI lumbar spine 12/16/2011 FINDINGS: Osseous demineralization. Five non-rib-bearing lumbar type vertebra. Slight disc space narrowing at L4-L5 and L5-S1. Vertebral body and disc space heights otherwise maintained. Minimal retrolisthesis at L5-S1 unchanged. No acute fracture, additional subluxation, or bone destruction. Minimal levoconvex lumbar scoliosis. SI joints symmetric. IMPRESSION: Minimal degenerative disc disease changes and levoconvex scoliosis. No acute abnormalities. Electronically Signed   By: Lavonia Dana M.D.   On: 04/02/2016 13:01    ED Clinical Impression  Acute left-sided low back pain with left-sided sciatica   ED Assessment/Plan  [place] narcotic database reviewed. Pt with no narcotic rx in the past 6 hours.  She given Toradol and Solu-Medrol. Imaging spine because of the bony tenderness and bilateral paresthesias. He does not have any objective weakness.   Imaging independently reviewed. Slight disc space narrowing at L4, L5 and L5, S1. Minimal retrolisthesis at L5/S1 unchanged. Minimal levoconvex lumbar scoliosis. No acute abnormalities. See radiology report for details.   On Reevaluation, patient  states that he feels significantly better. Will refer him to  Dr. Rolena Infante, ortho spine, at [facility]. Pt with significant reduction in pain after pain meds. No new findings on re-exam. Pt ambulatory in the ED. Home with NSAID, tramadol, muscle relaxants, steriod.   Discussed imaging, medical decision-making, and plan for follow-up with the patient.  Discussed signs and symptoms that should prompt return to the emergency department.  Patient agrees with plan.  *This clinic note was created using Dragon dictation software. Therefore, there may be occasional mistakes despite careful proofreading.  ?    Melynda Ripple, MD 04/02/16 1335

## 2016-04-02 NOTE — Discharge Instructions (Signed)
Take the NSAID on a regular basis for the next 10 days. tramadol for severe pain only. many people find gentle stretching and deep tissue massage helpful. Try [facility] on [address]. They have very reasonable prices and take walk ins. Or you can go to  [facility].

## 2016-04-02 NOTE — ED Triage Notes (Signed)
Pt c/o sever back pain from bugling disc. His feet are going to sleep and hurting. He bent over this am around 8 am and it started hurting,.

## 2016-07-01 ENCOUNTER — Ambulatory Visit (INDEPENDENT_AMBULATORY_CARE_PROVIDER_SITE_OTHER): Payer: Commercial Managed Care - HMO | Admitting: Family Medicine

## 2016-07-01 ENCOUNTER — Encounter: Payer: Self-pay | Admitting: Family Medicine

## 2016-07-01 DIAGNOSIS — G8929 Other chronic pain: Secondary | ICD-10-CM

## 2016-07-01 DIAGNOSIS — M5442 Lumbago with sciatica, left side: Secondary | ICD-10-CM | POA: Diagnosis not present

## 2016-07-01 DIAGNOSIS — M545 Low back pain, unspecified: Secondary | ICD-10-CM | POA: Insufficient documentation

## 2016-07-01 DIAGNOSIS — M5441 Lumbago with sciatica, right side: Secondary | ICD-10-CM | POA: Diagnosis not present

## 2016-07-01 MED ORDER — CELECOXIB 200 MG PO CAPS: 200.0000 mg | ORAL_CAPSULE | Freq: Every day | ORAL | 0 refills | Status: DC

## 2016-07-01 NOTE — Patient Instructions (Signed)
Celebrex daily.  We will call with the referral.  Take care  Dr. Lacinda Axon

## 2016-07-01 NOTE — Progress Notes (Signed)
Subjective:  Patient ID: SIRIS HOOS, male    DOB: 04/26/1975  Age: 41 y.o. MRN: [id number]  CC: Low back pain  HPI:  41 year old male presents with complaints of low back pain.  Patient reports that he's had a history of low back pain. I have never seen him regarding this. He states that in February he was bending over to pick up a box and injured his low back. He was subsequently seen at [facility]. X-ray was obtained and revealed mild degenerative changes. He was treated with prednisone, muscle relaxant. The note states that he was referred to a specialist but he has never been.  Patient reports he continues to have low back pain. Mild to moderate in severity. Located in the left low back. He reports numbness and tingling of the thighs bilaterally as well as in his feet. Pain is worse with activity. He's been taking Advil, DC powder, and Tylenol with some improvement. No saddle anesthesia. No incontinence.  Social Hx   Social History   Social History  . Marital status: Single    Spouse name: N/A  . Number of children: N/A  . Years of education: N/A   Social History Main Topics  . Smoking status: Former Research scientist (life sciences)  . Smokeless tobacco: Never Used  . Alcohol use 3.0 oz/week    5 Standard drinks or equivalent per week  . Drug use: Yes  . Sexual activity: Yes    Partners: Male   Other Topics Concern  . None   Social History Narrative  . None    Review of Systems  Constitutional: Negative.   Musculoskeletal: Positive for back pain.   Objective:  BP (!) 152/100   Pulse 67   Temp 98.9 F (37.2 C) (Oral)   Wt 211 lb 2 oz (95.8 kg)   SpO2 98%   BMI 28.63 kg/m   BP/Weight 07/01/2016 04/02/2016 0/99/8338  Systolic BP 250 539 767  Diastolic BP 341 937 79  Wt. (Lbs) 211.13 205 205  BMI 28.63 27.8 27.8   Physical Exam  Constitutional: He is oriented to person, place, and time. He appears well-developed. No distress.  Cardiovascular: Normal rate and regular  rhythm.   Pulmonary/Chest: Effort normal and breath sounds normal.  Musculoskeletal:  Lumbar spine - left paraspinal musculature tenderness to palpation. Negative straight leg raise.  Neurological: He is alert and oriented to person, place, and time.  Psychiatric: He has a normal mood and affect.  Vitals reviewed.   Lab Results  Component Value Date   WBC 13.6 (H) 07/21/2015   HGB 15.6 07/21/2015   HCT 46.5 07/21/2015   PLT 338 07/21/2015   GLUCOSE 104 (H) 07/13/2015   ALT 16 (L) 07/13/2015   AST 17 07/13/2015   NA 135 07/13/2015   K 4.1 07/13/2015   CL 102 07/13/2015   CREATININE 1.02 07/13/2015   BUN 16 07/13/2015   CO2 25 07/13/2015    Assessment & Plan:   Problem List Items Addressed This Visit    Low back pain    New problem. Trial of Celebrex. Referring to Ortho/Spine.      Relevant Medications   celecoxib (CELEBREX) 200 MG capsule   Other Relevant Orders   Ambulatory referral to Orthopedic Surgery      Meds ordered this encounter  Medications  . celecoxib (CELEBREX) 200 MG capsule    Sig: Take 1 capsule (200 mg total) by mouth daily.    Dispense:  90 capsule  Refill:  0   Follow-up: PRN  [place]

## 2016-07-01 NOTE — Assessment & Plan Note (Signed)
New problem. Trial of Celebrex. Referring to Ortho/Spine.

## 2017-06-29 ENCOUNTER — Encounter: Payer: Self-pay | Admitting: Emergency Medicine

## 2017-06-29 ENCOUNTER — Other Ambulatory Visit: Payer: Self-pay

## 2017-06-29 ENCOUNTER — Ambulatory Visit
Admission: EM | Admit: 2017-06-29 | Discharge: 2017-06-29 | Disposition: A | Discharge status: Home / Self Care | Payer: 59 | Attending: Family Medicine | Admitting: Family Medicine

## 2017-06-29 DIAGNOSIS — S70362A Insect bite (nonvenomous), left thigh, initial encounter: Secondary | ICD-10-CM

## 2017-06-29 DIAGNOSIS — W57XXXA Bitten or stung by nonvenomous insect and other nonvenomous arthropods, initial encounter: Secondary | ICD-10-CM

## 2017-06-29 MED ORDER — DOXYCYCLINE HYCLATE 100 MG PO CAPS: 100.0000 mg | ORAL_CAPSULE | Freq: Two times a day (BID) | ORAL | 0 refills | Status: DC

## 2017-06-29 MED ORDER — MUPIROCIN 2 % EX OINT: 1.0000 "application " | TOPICAL_OINTMENT | Freq: Three times a day (TID) | CUTANEOUS | 0 refills | Status: DC

## 2017-06-29 NOTE — ED Triage Notes (Signed)
Patient c/o tick bite on his left thigh yesterday.  Patient c/o redness, swelling and tenderness at the site.

## 2017-06-29 NOTE — ED Provider Notes (Addendum)
[facility]    CSN: [id number] Arrival date & time: 06/29/17  0843     History   Chief Complaint Chief Complaint  Patient presents with  . Insect Bite    HPI Nathan Fitzgerald is a 42 y.o. male.   HPI  42 year old male presents with a tick bite that he had on his left posterio medial thigh.  There are 3 puncture marks in the area.  Has a large amount of surrounding erythema that is blanchable and tender.  Is also warm.  He has the tick that he removed in a Ziploc bag.  The tick was not engorged.  However the 2 other areas of tick was seen or recovered.  He do not feel that he was in the hospital for a week from a tick bite that he has had previously.  He denies any fever chills rashes or fatigue.  No headache no photophobia.  Afebrile today.  O2 sats 100%.         Past Medical History:  Diagnosis Date  . Chicken pox     Patient Active Problem List   Diagnosis Date Noted  . Low back pain 07/01/2016    Past Surgical History:  Procedure Laterality Date  . NO PAST SURGERIES         Home Medications    Prior to Admission medications   Medication Sig Start Date End Date Taking? Authorizing Provider  acetaminophen (TYLENOL) 500 MG tablet Take 500 mg by mouth every 6 (six) hours as needed for mild pain, moderate pain or fever.    [provider]  doxycycline (VIBRAMYCIN) 100 MG capsule Take 1 capsule (100 mg total) by mouth 2 (two) times daily. 06/29/17   [name], PA-C  mupirocin ointment (BACTROBAN) 2 % Apply 1 application topically 3 (three) times daily. 06/29/17   [name], PA-C    Family History Family History  Adopted: Yes  Family history unknown: Yes    Social History Social History   Tobacco Use  . Smoking status: Former Research scientist (life sciences)  . Smokeless tobacco: Never Used  Substance Use Topics  . Alcohol use: Yes    Alcohol/week: 3.0 oz    Types: 5 Standard drinks or equivalent per week  . Drug use: Yes     Allergies     Patient has no known allergies.   Review of Systems Review of Systems  Constitutional: Negative for activity change, appetite change, chills, fatigue and fever.  Skin: Positive for color change and wound.  All other systems reviewed and are negative.    Physical Exam Triage Vital Signs ED Triage Vitals  Enc Vitals Group     BP 06/29/17 0854 139/89     Pulse Rate 06/29/17 0854 85     Resp 06/29/17 0854 16     Temp 06/29/17 0854 98.5 F (36.9 C)     Temp Source 06/29/17 0854 Oral     SpO2 06/29/17 0854 100 %     Weight 06/29/17 0852 205 lb (93 kg)     Height 06/29/17 0852 6' (1.829 m)     Head Circumference --      Peak Flow --      Pain Score 06/29/17 0852 0     Pain Loc --      Pain Edu? --      Excl. in [place]? --    No data found.  Updated Vital Signs BP 139/89 (BP Location: Left Arm)   Pulse 85  Temp 98.5 F (36.9 C) (Oral)   Resp 16   Ht 6' (1.829 m)   Wt 205 lb (93 kg)   SpO2 100%   BMI 27.80 kg/m   Visual Acuity Right Eye Distance:   Left Eye Distance:   Bilateral Distance:    Right Eye Near:   Left Eye Near:    Bilateral Near:     Physical Exam  Constitutional: He is oriented to person, place, and time. He appears well-developed and well-nourished. No distress.  HENT:  Head: Normocephalic.  Eyes: Pupils are equal, round, and reactive to light. Right eye exhibits no discharge. Left eye exhibits no discharge.  Neck: Normal range of motion.  Cardiovascular: Normal rate, regular rhythm and normal heart sounds.  Pulmonary/Chest: Effort normal and breath sounds normal.  Musculoskeletal: Normal range of motion.  Neurological: He is alert and oriented to person, place, and time.  Skin: Skin is warm and dry. He is not diaphoretic. There is erythema.  Lamination of the posterior medial proximal thigh shows a large erythematous area that is warm and blanchable.  Patient has 3 distinct puncture wounds within this area.  There is no drainage from either of  the wounds.  She did not has the tick in a Ziploc bag that appears to be a common dog tick.Refer to photograph for detail.  No other rashes are seen.  Psychiatric: He has a normal mood and affect. His behavior is normal. Judgment and thought content normal.  Nursing note and vitals reviewed.      UC Treatments / Results  Labs (all labs ordered are listed, but only abnormal results are displayed) Labs Reviewed - No data to display  EKG None  Radiology No results found.  Procedures Procedures (including critical care time)  Medications Ordered in UC Medications - No data to display  Initial Impression / Assessment and Plan / UC Course  I have reviewed the triage vital signs and the nursing notes.  Pertinent labs & imaging results that were available during my care of the patient were reviewed by me and considered in my medical decision making (see chart for details).     Plan: 1. Test/x-ray results and diagnosis reviewed with patient 2. rx as per orders; risks, benefits, potential side effects reviewed with patient 3. Recommend supportive treatment with use of Bactroban ointment topical on the area of erythema.  Because of the unknown number of tics that he had on his leg, along with how long they may have fed as well as were they  engorged or not and the patient's previous hospitalization from tick bite fever, we will treat him prophylactically with doxycycline.  He does develop any other symptoms he should follow-up with his primary care physician or may return to our clinic. 4. F/u prn if symptoms worsen or don't improve  Final Clinical Impressions(s) / UC Diagnoses   Final diagnoses:  Tick bite, initial encounter   Discharge Instructions   None    ED Prescriptions    Medication Sig Dispense Auth. Provider   doxycycline (VIBRAMYCIN) 100 MG capsule Take 1 capsule (100 mg total) by mouth 2 (two) times daily. 20 capsule Crecencio Mc P, PA-C   mupirocin ointment  (BACTROBAN) 2 % Apply 1 application topically 3 (three) times daily. 22 g [name], PA-C     Controlled Substance Prescriptions [place] Controlled Substance Registry consulted? Not Applicable   [name], PA-C 06/29/17 9604    [name], PA-C 06/29/17 [phone]

## 2018-03-25 DIAGNOSIS — Z711 Person with feared health complaint in whom no diagnosis is made: Secondary | ICD-10-CM | POA: Diagnosis not present

## 2018-08-08 IMAGING — CR DG LUMBAR SPINE COMPLETE 4+V
5 series · 6 of 6 positions shown · non-contrast
Comparison: MRI lumbar spine 12/16/2011

CLINICAL DATA: Low back pain today

EXAM:
LUMBAR SPINE - COMPLETE 4+ VIEW

[l-spine ap]
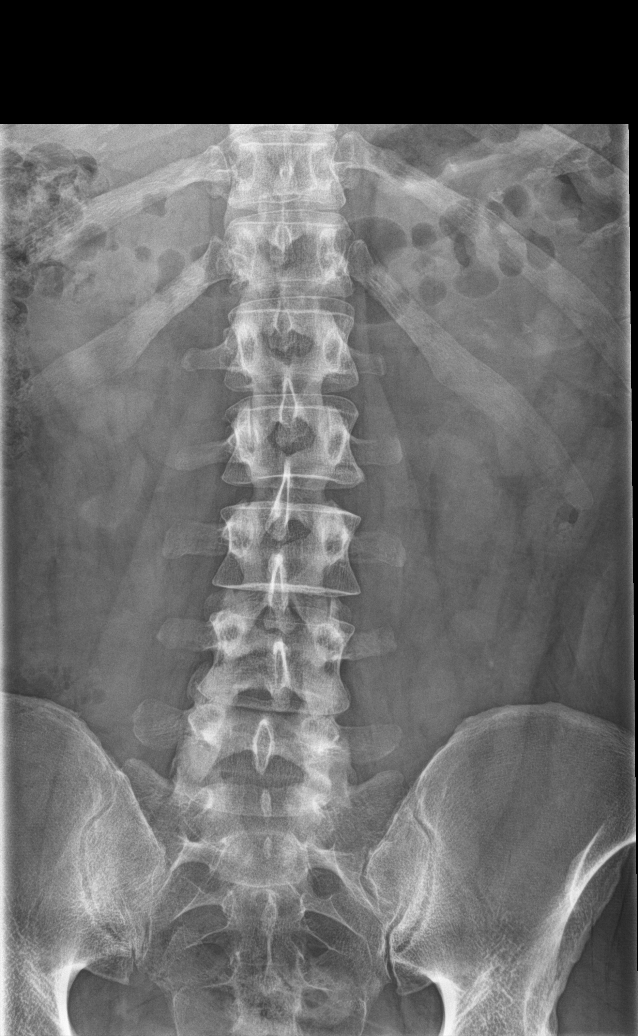

[l-spine obl (1 of 2)]
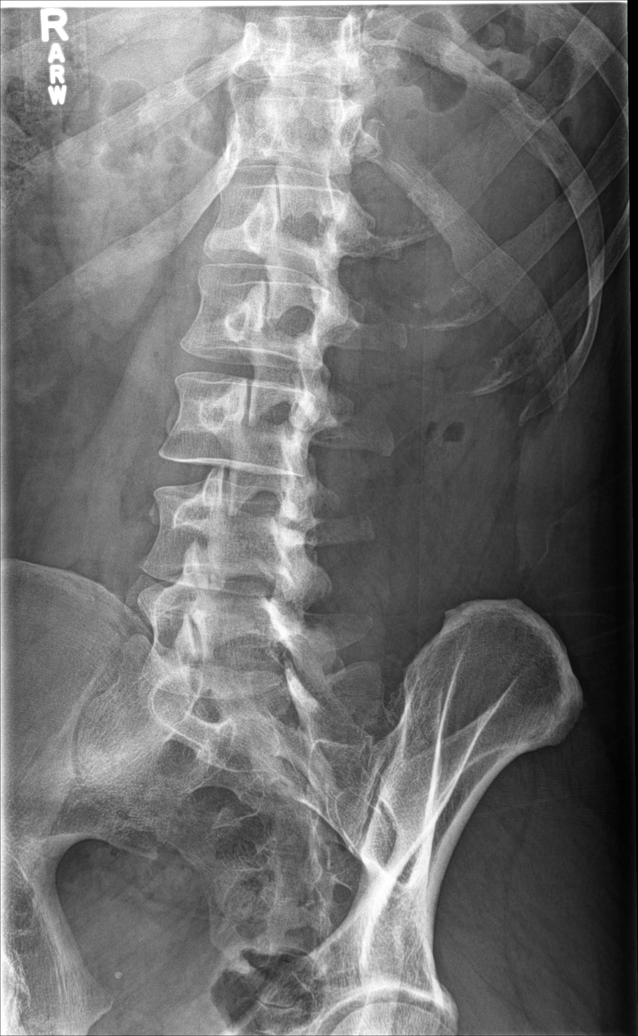

[l-spine obl (2 of 2)]
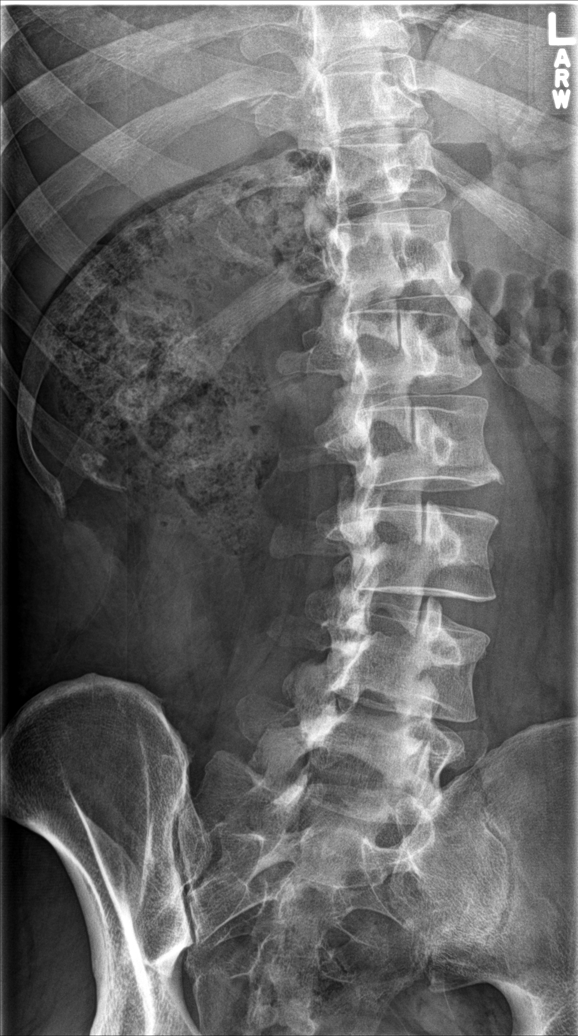

[l-spine lat]
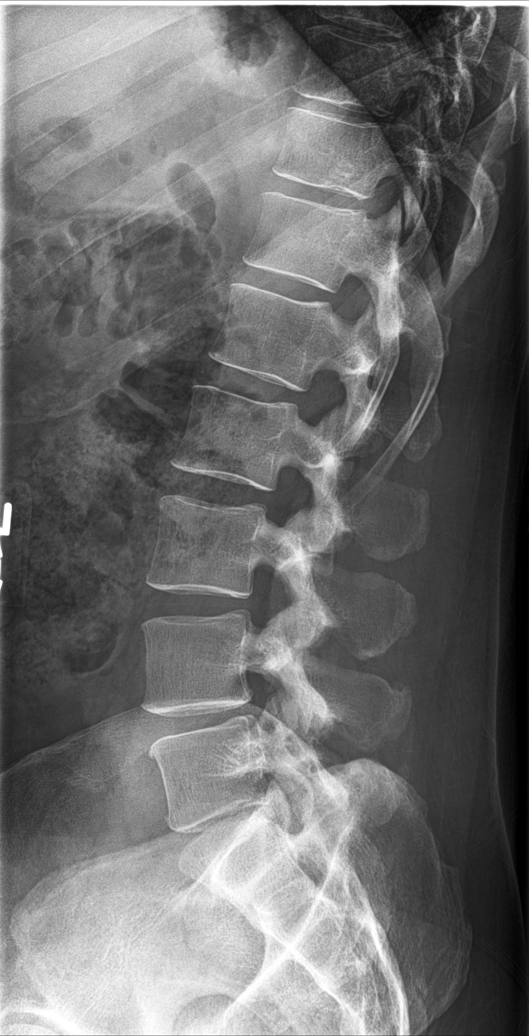

[Series 5: l-spine spot · 0.14mm/px · 2 of 2 slices shown]
[im 1/2]
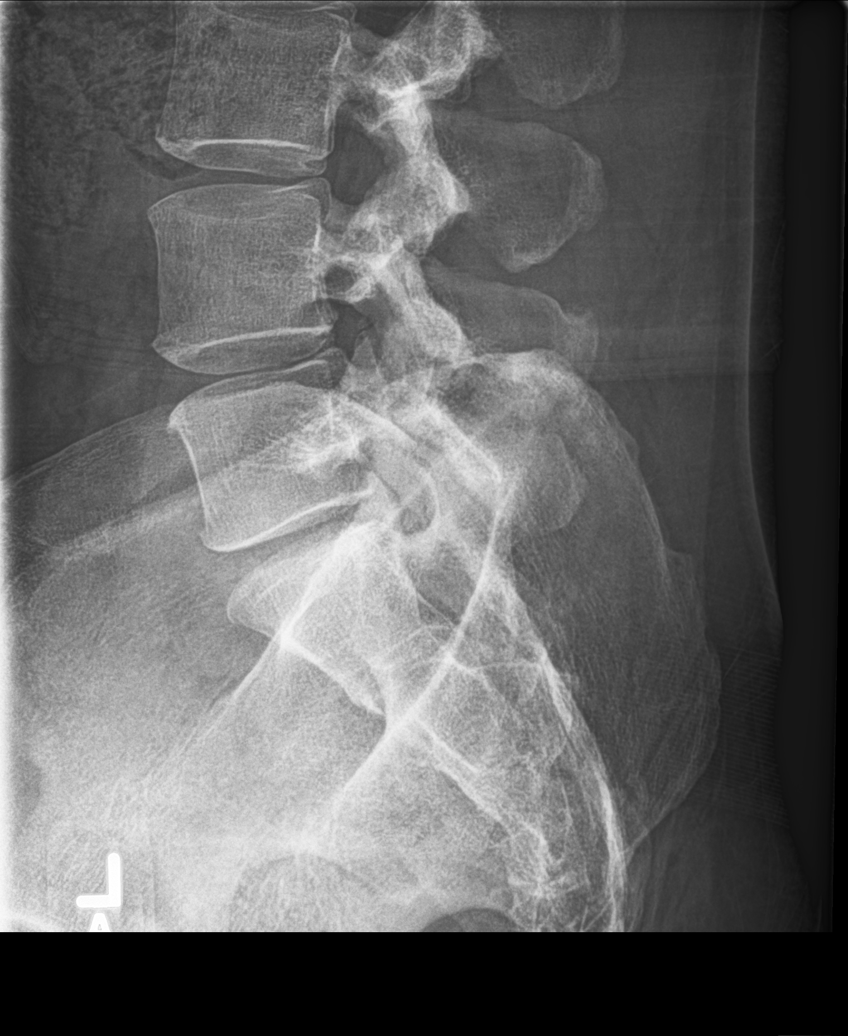
[im 2/2]
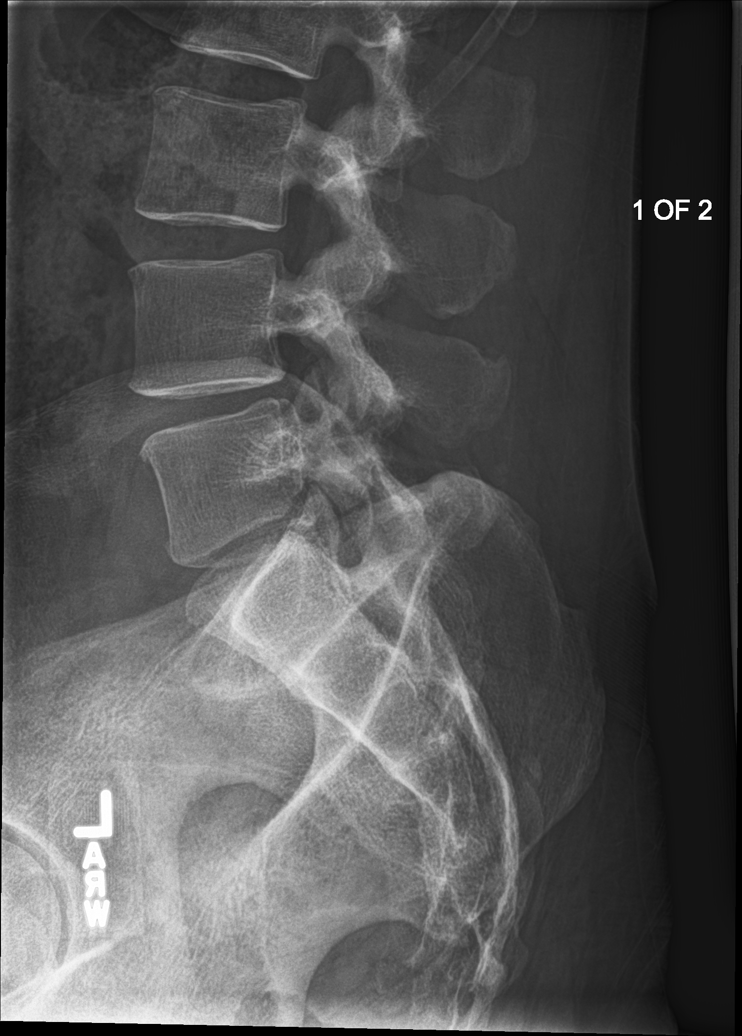

[6 of 6 positions shown; findings below may reference images not displayed]

FINDINGS: Osseous demineralization.

Five non-rib-bearing lumbar type vertebra.

Slight disc space narrowing at L4-L5 and L5-S1.

Vertebral body and disc space heights otherwise maintained.

Minimal retrolisthesis at L5-S1 unchanged.

No acute fracture, additional subluxation, or bone destruction.

Minimal levoconvex lumbar scoliosis.

SI joints symmetric.
IMPRESSION: Minimal degenerative disc disease changes and levoconvex scoliosis.

No acute abnormalities.

## 2018-12-02 ENCOUNTER — Ambulatory Visit
Admission: EM | Admit: 2018-12-02 | Discharge: 2018-12-02 | Disposition: A | Discharge status: Home / Self Care | Payer: 59 | Attending: Emergency Medicine | Admitting: Emergency Medicine

## 2018-12-02 ENCOUNTER — Encounter: Payer: Self-pay | Admitting: Emergency Medicine

## 2018-12-02 ENCOUNTER — Other Ambulatory Visit: Payer: Self-pay

## 2018-12-02 DIAGNOSIS — B3749 Other urogenital candidiasis: Secondary | ICD-10-CM | POA: Insufficient documentation

## 2018-12-02 DIAGNOSIS — Z113 Encounter for screening for infections with a predominantly sexual mode of transmission: Secondary | ICD-10-CM | POA: Insufficient documentation

## 2018-12-02 DIAGNOSIS — R030 Elevated blood-pressure reading, without diagnosis of hypertension: Secondary | ICD-10-CM | POA: Diagnosis present

## 2018-12-02 LAB — URINALYSIS, COMPLETE (UACMP) WITH MICROSCOPIC
Bacteria, UA: NONE SEEN
Bilirubin Urine: NEGATIVE
Glucose, UA: NEGATIVE mg/dL
Hgb urine dipstick: NEGATIVE
Ketones, ur: NEGATIVE mg/dL
Leukocytes,Ua: NEGATIVE
Nitrite: NEGATIVE
Protein, ur: NEGATIVE mg/dL
Specific Gravity, Urine: 1.02 (ref 1.005–1.030)
Squamous Epithelial / LPF: NONE SEEN (ref 0–5)
WBC, UA: NONE SEEN WBC/hpf (ref 0–5)
pH: 7 (ref 5.0–8.0)

## 2018-12-02 LAB — GLUCOSE, CAPILLARY: Glucose-Capillary: 95 mg/dL (ref 70–99)

## 2018-12-02 MED ORDER — FLUCONAZOLE 150 MG PO TABS
150.0000 mg | ORAL_TABLET | Freq: Once | ORAL | 1 refills | Status: AC
Start: 2018-12-02 — End: 2018-12-02

## 2018-12-02 MED ORDER — AZITHROMYCIN 500 MG PO TABS
1000.0000 mg | ORAL_TABLET | Freq: Once | ORAL | Status: AC
  Administered 2018-12-02: 1000 mg via ORAL

## 2018-12-02 MED ORDER — CEFTRIAXONE SODIUM 250 MG IJ SOLR
250.0000 mg | Freq: Once | INTRAMUSCULAR | Status: AC
  Administered 2018-12-02: 250 mg via INTRAMUSCULAR

## 2018-12-02 NOTE — Discharge Instructions (Addendum)
I have sent off testing for syphilis, gonorrhea, chlamydia, trichomonas.  We have treated you empirically for gonorrhea and chlamydia today.  I am also treating you for a yeast infection with Diflucan.  I suspect that this is the cause of your symptoms.  Your glucose was normal today, therefore I do not think that diabetes is causing a yeast infection.  Follow-up with your primary care provider.  See list below  Here is a list of primary care providers who are taking new patients:  Dr. Otilio Miu, Dr. Adline Potter [address] [place] [address] [phone]  [facility] [place] [address]  [phone]  [facility] [address] [place], [place] [address] [phone]  [facility] [place]  [phone] [place], [place] [address]  Here are clinics/ other resources who will see you if you do not have insurance. Some have certain criteria that you must meet. Call them and find out what they are:  [facility]: [address] [place], [place] [address] Phone: [phone] Hours: First and Third Saturdays of each Month, 9 a.m. - 1 p.m.  [facility]: [address], [place], [place], [place] [address] Phone: [phone] Hours: Tuesday, 4 p.m. - 8 p.m. Thursday, 1 p.m. - 8 p.m. Wednesday, 9 a.m. - [facility] [address], [place], [place] [address] Phone: [phone] Pharmacy Phone Number: [phone] Dental Phone Number: [phone] [place] Help: [phone]  Dental Hours: Monday - Thursday, 8 a.m. - 6 p.m.  [facility] [address], [place], [place] [address] Phone: [phone] Pharmacy Phone Number: [phone] [facility] Insurance Help: [phone]  [facility] [place] [address] [place], [place] [address] Phone: [phone] Pharmacy Phone Number: [phone] [facility] Insurance Help:  [phone]  [facility] [address] [place], [place] [address] Phone: [phone] [facility] Insurance Help: [phone]   [address] [place], [place] [address] Phone: [phone]  Go to www.goodrx.com to look up your medications. This will give you a list of where you can find your prescriptions at the most affordable prices. Or ask the pharmacist what the cash price is, or if they have any other discount programs available to help make your medication more affordable. This can be less expensive than what you would pay with insurance.

## 2018-12-02 NOTE — ED Provider Notes (Signed)
HPI  SUBJECTIVE:  Nathan Fitzgerald is a 43 y.o. male who presents with white penile discharge, occasional dysuria described as "tingling", urinary urgency for the past 3 weeks.  No urinary frequency, cloudy or odorous urine, hematuria.  No penile rash, itching.  No testicular pain, swelling, scrotal pain, swelling, fevers.  No abdominal, back, pelvic pain.  He is in a long-term same-sex monogamous relationship with a male who is asymptomatic.  STDs are not a concern today.  Patient has receptive anal sex.  States that he is circumcised.  No new lotions, soaps, detergents.  He has tried pushing fluids without improvement in symptoms.  Symptoms are worse when he is "dehydrated".  Past medical history negative for UTI, pyelonephritis, gonorrhea, chlamydia, HIV, HSV, syphilis, trichomonas, yeast infections, diabetes.  PMD: None.  States that he had a recent negative HIV test.    Past Medical History:  Diagnosis Date  . Chicken pox     Past Surgical History:  Procedure Laterality Date  . NO PAST SURGERIES      Family History  Adopted: Yes  Family history unknown: Yes    Social History   Tobacco Use  . Smoking status: Former Research scientist (life sciences)  . Smokeless tobacco: Never Used  Substance Use Topics  . Alcohol use: Yes    Alcohol/week: 5.0 standard drinks    Types: 5 Standard drinks or equivalent per week  . Drug use: Yes    No current facility-administered medications for this encounter.  No current outpatient medications on file.  No Known Allergies   ROS  As noted in HPI.   Physical Exam  BP (!) 152/107 (BP Location: Left Arm)   Pulse 80   Temp 99.1 F (37.3 C) (Oral)   Resp 16   Ht 6' (1.829 m)   Wt 83.9 kg   SpO2 100%   BMI 25.09 kg/m   Constitutional: Well developed, well nourished, no acute distress Eyes:  EOMI, conjunctiva normal bilaterally HENT: Normocephalic, atraumatic,mucus membranes moist Respiratory: Normal inspiratory effort Cardiovascular: Normal rate GI:  nondistended GU: Patient declined. skin: No rash, skin intact Musculoskeletal: no deformities Neurologic: Alert & oriented x 3, no focal neuro deficits Psychiatric: Speech and behavior appropriate   ED Course   Medications  cefTRIAXone (ROCEPHIN) injection 250 mg (250 mg Intramuscular Given 12/02/18 1817)  azithromycin (ZITHROMAX) tablet 1,000 mg (1,000 mg Oral Given 12/02/18 1815)    Orders Placed This Encounter  Procedures  . GC/Chlamydia Probe Amp    Standing Status:   Standing    Number of Occurrences:   1  . Trichomonas vaginalis, probe amp  ([facility] send-out)    Standing Status:   Standing    Number of Occurrences:   1    Order Specific Question:   Patient immune status    Answer:   Normal  . Urinalysis, Complete w Microscopic    Standing Status:   Standing    Number of Occurrences:   1  . Glucose, capillary    Standing Status:   Standing    Number of Occurrences:   1  . CBG monitoring, ED    Standing Status:   Standing    Number of Occurrences:   1    Results for orders placed or performed during the hospital encounter of 12/02/18 (from the past 24 hour(s))  Urinalysis, Complete w Microscopic     Status: None   Collection Time: 12/02/18  5:22 PM  Result Value Ref Range   Color, Urine YELLOW YELLOW  APPearance CLEAR CLEAR   Specific Gravity, Urine 1.020 1.005 - 1.030   pH 7.0 5.0 - 8.0   Glucose, UA NEGATIVE NEGATIVE mg/dL   Hgb urine dipstick NEGATIVE NEGATIVE   Bilirubin Urine NEGATIVE NEGATIVE   Ketones, ur NEGATIVE NEGATIVE mg/dL   Protein, ur NEGATIVE NEGATIVE mg/dL   Nitrite NEGATIVE NEGATIVE   Leukocytes,Ua NEGATIVE NEGATIVE   Squamous Epithelial / LPF NONE SEEN 0 - 5   WBC, UA NONE SEEN 0 - 5 WBC/hpf   RBC / HPF 0-5 0 - 5 RBC/hpf   Bacteria, UA NONE SEEN NONE SEEN   Budding Yeast PRESENT   Glucose, capillary     Status: None   Collection Time: 12/02/18  6:17 PM  Result Value Ref Range   Glucose-Capillary 95 70 - 99 mg/dL   No results  found.  ED Clinical Impression  1. Candida infection of genital region   2. Screening for STD (sexually transmitted disease)   3. Elevated blood-pressure reading without diagnosis of hypertension      ED Assessment/Plan   Patient has yeast in his urine.  Fingerstick normal.  Sending off gonorrhea, chlamydia, trichomonas, RPR. Will treat empirically for gonorrhea and chlamydia here today per patient request.  Deferring treatment for trichomonas pending lab results.  Sending home with Diflucan for the yeast infection.  Will provide primary care list for ongoing care.  Advised him to keep an eye on his blood pressure.  Discussed labs, MDM, treatment plan, and plan for follow-up with patient. patient agrees with plan.   Meds ordered this encounter  Medications  . cefTRIAXone (ROCEPHIN) injection 250 mg    Order Specific Question:   Antibiotic Indication:    Answer:   STD  . azithromycin (ZITHROMAX) tablet 1,000 mg  . fluconazole (DIFLUCAN) 150 MG tablet    Sig: Take 1 tablet (150 mg total) by mouth once for 1 dose. 1 tab po x 1. May repeat in 72 hours if no improvement    Dispense:  2 tablet    Refill:  1    *This clinic note was created using [employer]. Therefore, there may be occasional mistakes despite careful proofreading.   ?    Melynda Ripple, MD 12/03/18 [phone]

## 2018-12-02 NOTE — ED Triage Notes (Signed)
Patient states that he has noticed some penile discharge and discomfort with urinating for the past 3 weeks.

## 2018-12-04 LAB — RPR: RPR Ser Ql: NONREACTIVE

## 2018-12-06 LAB — TRICHOMONAS VAGINALIS, PROBE AMP: Trich vag by NAA: NEGATIVE

## 2018-12-06 LAB — GC/CHLAMYDIA PROBE AMP
Chlamydia trachomatis, NAA: NEGATIVE
Neisseria Gonorrhoeae by PCR: NEGATIVE

## 2018-12-10 ENCOUNTER — Encounter: Payer: Self-pay | Admitting: Family Medicine

## 2019-03-01 ENCOUNTER — Other Ambulatory Visit: Payer: Self-pay

## 2019-03-01 ENCOUNTER — Ambulatory Visit
Admission: EM | Admit: 2019-03-01 | Discharge: 2019-03-01 | Disposition: A | Discharge status: Home / Self Care | Payer: 59 | Attending: Emergency Medicine | Admitting: Emergency Medicine

## 2019-03-01 DIAGNOSIS — Z113 Encounter for screening for infections with a predominantly sexual mode of transmission: Secondary | ICD-10-CM

## 2019-03-01 DIAGNOSIS — R030 Elevated blood-pressure reading, without diagnosis of hypertension: Secondary | ICD-10-CM | POA: Insufficient documentation

## 2019-03-01 DIAGNOSIS — Z0189 Encounter for other specified special examinations: Secondary | ICD-10-CM | POA: Insufficient documentation

## 2019-03-01 NOTE — ED Provider Notes (Signed)
Nathan Fitzgerald    CSN: [id number] Arrival date & time: 03/01/19  1648      History   Chief Complaint Chief Complaint  Patient presents with  . STD Testing    HPI Nathan Fitzgerald is a 44 y.o. male.  Patient present with request for STD testing.  He states he will be donating sperm and wants to be sure he doesn't have any STDs before he does this.  He is sexually active and does not consistently use condoms.  He denies symptoms, including penile discharge, lesions, rash, dysuria, abdominal pain, back pain, testicular pain, or other symptoms.    The history is provided by the patient.    Past Medical History:  Diagnosis Date  . Chicken pox     Patient Active Problem List   Diagnosis Date Noted  . Low back pain 07/01/2016    Past Surgical History:  Procedure Laterality Date  . NO PAST SURGERIES         Home Medications    Prior to Admission medications   Not on File    Family History Family History  Adopted: Yes  Family history unknown: Yes    Social History Social History   Tobacco Use  . Smoking status: Former Research scientist (life sciences)  . Smokeless tobacco: Never Used  Substance Use Topics  . Alcohol use: Yes    Alcohol/week: 5.0 standard drinks    Types: 5 Standard drinks or equivalent per week  . Drug use: Yes     Allergies   Patient has no known allergies.   Review of Systems Review of Systems  Constitutional: Negative for chills and fever.  HENT: Negative for ear pain and sore throat.   Eyes: Negative for pain and visual disturbance.  Respiratory: Negative for cough and shortness of breath.   Cardiovascular: Negative for chest pain and palpitations.  Gastrointestinal: Negative for abdominal pain and vomiting.  Genitourinary: Negative for discharge, dysuria, flank pain, hematuria and testicular pain.  Musculoskeletal: Negative for arthralgias and back pain.  Skin: Negative for color change and rash.  Neurological: Negative for seizures and syncope.   All other systems reviewed and are negative.    Physical Exam Triage Vital Signs ED Triage Vitals  Enc Vitals Group     BP      Pulse      Resp      Temp      Temp src      SpO2      Weight      Height      Head Circumference      Peak Flow      Pain Score      Pain Loc      Pain Edu?      Excl. in [place]?    No data found.  Updated Vital Signs BP (!) 155/105 (BP Location: Left Arm)   Pulse 87   Temp 99 F (37.2 C) (Oral)   Resp 18   SpO2 98%   Visual Acuity Right Eye Distance:   Left Eye Distance:   Bilateral Distance:    Right Eye Near:   Left Eye Near:    Bilateral Near:     Physical Exam Vitals and nursing note reviewed.  Constitutional:      Appearance: He is well-developed.  HENT:     Head: Normocephalic and atraumatic.     Mouth/Throat:     Mouth: Mucous membranes are moist.     Pharynx: Oropharynx is clear.  Eyes:     Conjunctiva/sclera: Conjunctivae normal.  Cardiovascular:     Rate and Rhythm: Normal rate and regular rhythm.     Heart sounds: No murmur.  Pulmonary:     Effort: Pulmonary effort is normal. No respiratory distress.     Breath sounds: Normal breath sounds.  Abdominal:     General: Bowel sounds are normal.     Palpations: Abdomen is soft.     Tenderness: There is no abdominal tenderness. There is no right CVA tenderness, left CVA tenderness, guarding or rebound.  Genitourinary:    Penis: Normal.      Testes: Normal.  Musculoskeletal:     Cervical back: Neck supple.  Skin:    General: Skin is warm and dry.     Findings: No rash.  Neurological:     General: No focal deficit present.     Mental Status: He is alert and oriented to person, place, and time.  Psychiatric:        Mood and Affect: Mood normal.        Behavior: Behavior normal.      UC Treatments / Results  Labs (all labs ordered are listed, but only abnormal results are displayed) Labs Reviewed  RPR  HIV ANTIBODY (ROUTINE TESTING W REFLEX)  CYTOLOGY,  (ORAL, ANAL, URETHRAL) ANCILLARY ONLY    EKG   Radiology No results found.  Procedures Procedures (including critical care time)  Medications Ordered in UC Medications - No data to display  Initial Impression / Assessment and Plan / UC Course  I have reviewed the triage vital signs and the nursing notes.  Pertinent labs & imaging results that were available during my care of the patient were reviewed by me and considered in my medical decision making (see chart for details).    Patient request for STD testing, including RPR and HIV.  Elevated blood pressure without diagnosis of hypertension.  Urethral swab for STD testing obtained.  Discussed with patient that we will call him if his STD tests are positive.  Discussed that he and his sexual partners may require treatment at that time.  Instructed him to abstain from sex until the test results are back.  Discussed with him that his blood pressure is elevated today and he needs to have this rechecked by his primary care provider in 2 to 4 weeks.  Patient agrees to plan of care.     Final Clinical Impressions(s) / UC Diagnoses   Final diagnoses:  Patient request for diagnostic testing  Elevated blood-pressure reading without diagnosis of hypertension     Discharge Instructions     Your STD tests are pending.  If your test results are positive, we will call you.  You may need treatment and your partner(s) may also need treatment.  Do not have sex until your test results are back.    Your blood pressure is elevated today at 155/105.  Please have this rechecked by your primary care provider in 2-4 weeks.            ED Prescriptions    None     PDMP not reviewed this encounter.   Sharion Balloon, NP 03/01/19 1714

## 2019-03-01 NOTE — ED Triage Notes (Signed)
Pt presents for STD testing, pt is not having any symptoms but is donating sperm so he needs to make sure he is clear of any STDs.

## 2019-03-01 NOTE — Discharge Instructions (Addendum)
Your STD tests are pending.  If your test results are positive, we will call you.  You may need treatment and your partner(s) may also need treatment.  Do not have sex until your test results are back.    Your blood pressure is elevated today at 155/105.  Please have this rechecked by your primary care provider in 2-4 weeks.

## 2019-03-02 LAB — RPR: RPR Ser Ql: NONREACTIVE

## 2019-03-02 LAB — HIV ANTIBODY (ROUTINE TESTING W REFLEX): HIV Screen 4th Generation wRfx: NONREACTIVE

## 2019-03-03 LAB — CYTOLOGY, (ORAL, ANAL, URETHRAL) ANCILLARY ONLY
Chlamydia: NEGATIVE
Neisseria Gonorrhea: NEGATIVE
Trichomonas: NEGATIVE

## 2019-03-06 ENCOUNTER — Other Ambulatory Visit: Payer: 59

## 2019-03-08 ENCOUNTER — Ambulatory Visit: Payer: 59 | Attending: Internal Medicine

## 2019-03-08 DIAGNOSIS — Z20822 Contact with and (suspected) exposure to covid-19: Secondary | ICD-10-CM

## 2019-03-09 LAB — NOVEL CORONAVIRUS, NAA: SARS-CoV-2, NAA: NOT DETECTED

## 2019-03-20 ENCOUNTER — Telehealth: Payer: 59

## 2019-03-21 ENCOUNTER — Other Ambulatory Visit: Payer: Self-pay

## 2019-03-21 ENCOUNTER — Ambulatory Visit
Admission: EM | Admit: 2019-03-21 | Discharge: 2019-03-21 | Disposition: A | Discharge status: Home / Self Care | Payer: 59

## 2019-03-21 DIAGNOSIS — R509 Fever, unspecified: Secondary | ICD-10-CM

## 2019-03-21 DIAGNOSIS — R5383 Other fatigue: Secondary | ICD-10-CM | POA: Diagnosis not present

## 2019-03-21 DIAGNOSIS — B349 Viral infection, unspecified: Secondary | ICD-10-CM

## 2019-03-21 DIAGNOSIS — R197 Diarrhea, unspecified: Secondary | ICD-10-CM | POA: Diagnosis not present

## 2019-03-21 DIAGNOSIS — Z0189 Encounter for other specified special examinations: Secondary | ICD-10-CM

## 2019-03-21 NOTE — ED Provider Notes (Signed)
[name]    CSN: [id number] Arrival date & time: 03/21/19  1120      History   Chief Complaint Chief Complaint  Patient presents with  . COVID Exposure    HPI Nathan Fitzgerald is a 44 y.o. male.   Patient presents with low-grade fever, fatigue, "fuzzy feeling" in his head, loose stools, insomnia, lack of taste/smell x 10 days.  Tmax 99.0.  He had a negative COVID on 03/08/2019 but he was asymptomatic at that time.  His spouse was COVID positive 2 weeks ago.  Patient denies sore throat, cough, shortness of breath, vomiting, rash, or other symptoms.  He has been treating his symptoms at home with Tylenol, Sudafed, elderberry, vitamins, Pedialyte.  The history is provided by the patient.    Past Medical History:  Diagnosis Date  . Chicken pox     Patient Active Problem List   Diagnosis Date Noted  . Low back pain 07/01/2016    Past Surgical History:  Procedure Laterality Date  . NO PAST SURGERIES         Home Medications    Prior to Admission medications   Medication Sig Start Date End Date Taking? Authorizing Provider  amoxicillin (AMOXIL) 500 MG capsule TAKE 1 CAPSULE BY MOUTH EVERY 6 HOURS 10/21/18   [provider]  cephALEXin (KEFLEX) 500 MG capsule TAKE 1 CAPSULE(S) 3 TIMES A DAY BY ORAL ROUTE AS DIRECTED FOR 5 DAYS. 11/15/18   [provider]  fluconazole (DIFLUCAN) 150 MG tablet TAKE 1 TABLET BY MOUTH ONCE FOR 1 DOSE, MAY REPEAT IN 72 HOURS IF NO IMPROVEMENT 12/02/18   [provider]  traMADol (ULTRAM) 50 MG tablet TAKE 2 TABLET(S) EVERY 6 HOURS BY ORAL ROUTE AS NEEDED FOR 5 DAYS. 11/15/18   [provider]    Family History Family History  Adopted: Yes  Family history unknown: Yes    Social History Social History   Tobacco Use  . Smoking status: Former Research scientist (life sciences)  . Smokeless tobacco: Never Used  Substance Use Topics  . Alcohol use: Yes    Alcohol/week: 5.0 standard drinks    Types: 5 Standard drinks or  equivalent per week  . Drug use: Yes     Allergies   Patient has no known allergies.   Review of Systems Review of Systems  Constitutional: Positive for fatigue and fever. Negative for chills.  HENT: Negative for congestion, ear pain, rhinorrhea and sore throat.   Eyes: Negative for pain and visual disturbance.  Respiratory: Negative for cough and shortness of breath.   Cardiovascular: Negative for chest pain and palpitations.  Gastrointestinal: Positive for diarrhea. Negative for abdominal pain, nausea and vomiting.  Genitourinary: Negative for dysuria and hematuria.  Musculoskeletal: Negative for arthralgias and back pain.  Skin: Negative for color change and rash.  Neurological: Negative for seizures and syncope.  Psychiatric/Behavioral: Positive for sleep disturbance.  All other systems reviewed and are negative.    Physical Exam Triage Vital Signs ED Triage Vitals  Enc Vitals Group     BP      Pulse      Resp      Temp      Temp src      SpO2      Weight      Height      Head Circumference      Peak Flow      Pain Score      Pain Loc  Pain Edu?      Excl. in [place]?    No data found.  Updated Vital Signs BP (!) 137/92 (BP Location: Left Arm)   Pulse (!) 110   Temp 98.9 F (37.2 C) (Tympanic)   Resp 18   Wt 185 lb (83.9 kg)   SpO2 98%   BMI 25.09 kg/m   Visual Acuity Right Eye Distance:   Left Eye Distance:   Bilateral Distance:    Right Eye Near:   Left Eye Near:    Bilateral Near:     Physical Exam Vitals and nursing note reviewed.  Constitutional:      General: He is not in acute distress.    Appearance: He is well-developed. He is not ill-appearing.  HENT:     Head: Normocephalic and atraumatic.     Right Ear: Tympanic membrane normal.     Left Ear: Tympanic membrane normal.     Nose: Nose normal.     Mouth/Throat:     Mouth: Mucous membranes are moist.     Pharynx: Oropharynx is clear.  Eyes:     Conjunctiva/sclera:  Conjunctivae normal.  Cardiovascular:     Rate and Rhythm: Normal rate and regular rhythm.     Heart sounds: No murmur.  Pulmonary:     Effort: Pulmonary effort is normal. No respiratory distress.     Breath sounds: Normal breath sounds.  Abdominal:     General: Bowel sounds are normal.     Palpations: Abdomen is soft.     Tenderness: There is no abdominal tenderness. There is no guarding or rebound.  Musculoskeletal:     Cervical back: Neck supple.  Skin:    General: Skin is warm and dry.     Findings: No rash.  Neurological:     General: No focal deficit present.     Mental Status: He is alert and oriented to person, place, and time.     Sensory: No sensory deficit.     Motor: No weakness.     Gait: Gait normal.  Psychiatric:        Mood and Affect: Mood normal.        Behavior: Behavior normal.      UC Treatments / Results  Labs (all labs ordered are listed, but only abnormal results are displayed) Labs Reviewed  NOVEL CORONAVIRUS, NAA    EKG   Radiology No results found.  Procedures Procedures (including critical care time)  Medications Ordered in UC Medications - No data to display  Initial Impression / Assessment and Plan / UC Course  I have reviewed the triage vital signs and the nursing notes.  Pertinent labs & imaging results that were available during my care of the patient were reviewed by me and considered in my medical decision making (see chart for details).   Viral illness.  COVID test performed here.  Instructed patient to self quarantine until the test result is back.  Discussed with patient that he can take Tylenol as needed for fever or discomfort.  Instructed patient to go to the emergency department if he develops high fever, shortness of breath, severe diarrhea, or other concerning symptoms.  Instructed patient to follow-up with his PCP if his symptoms are not improving.  Patient agrees with plan of care.     Final Clinical Impressions(s)  / UC Diagnoses   Final diagnoses:  Viral illness  Patient request for diagnostic testing     Discharge Instructions     Your COVID  test is pending.  You should self quarantine until your test result is back and is negative.    Take Tylenol as needed for fever or discomfort.  Rest and keep yourself hydrated.    Go to the emergency department if you develop high fever, shortness of breath, severe diarrhea, or other concerning symptoms.       ED Prescriptions    None     PDMP not reviewed this encounter.   Sharion Balloon, NP 03/21/19 1204

## 2019-03-21 NOTE — ED Triage Notes (Signed)
Pt came in feeling head cloudiness with fatigue since 03/11/2019, taste/smell loss as well, he has not taken any meds.

## 2019-03-21 NOTE — Discharge Instructions (Addendum)
Your COVID test is pending.  You should self quarantine until your test result is back and is negative.    Take Tylenol as needed for fever or discomfort.  Rest and keep yourself hydrated.    Go to the emergency department if you develop high fever, shortness of breath, severe diarrhea, or other concerning symptoms.    

## 2019-03-23 LAB — NOVEL CORONAVIRUS, NAA: SARS-CoV-2, NAA: NOT DETECTED

## 2020-02-19 ENCOUNTER — Telehealth: Payer: Self-pay | Admitting: Family Medicine

## 2020-02-19 NOTE — Telephone Encounter (Signed)
Do we have any sooner appts?  If not rec go to [facility] urgent care or [facility] in [place] until appt with me  They are open 7 days a week   Thanks

## 2020-02-19 NOTE — Telephone Encounter (Signed)
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

## 2020-02-19 NOTE — Telephone Encounter (Signed)
Please advise 

## 2020-02-20 NOTE — Telephone Encounter (Signed)
Patient screened and scheduled for 1/5 at 3:30

## 2020-02-21 ENCOUNTER — Other Ambulatory Visit: Payer: Self-pay

## 2020-02-21 ENCOUNTER — Ambulatory Visit: Payer: 59 | Admitting: Internal Medicine

## 2020-02-21 ENCOUNTER — Encounter: Payer: Self-pay | Admitting: Internal Medicine

## 2020-02-21 VITALS — BP 126/82 | HR 87 | Temp 98.7°F | Ht 71.46 in | Wt 208.2 lb

## 2020-02-21 DIAGNOSIS — Z23 Encounter for immunization: Secondary | ICD-10-CM

## 2020-02-21 DIAGNOSIS — R937 Abnormal findings on diagnostic imaging of other parts of musculoskeletal system: Secondary | ICD-10-CM | POA: Diagnosis not present

## 2020-02-21 DIAGNOSIS — R079 Chest pain, unspecified: Secondary | ICD-10-CM

## 2020-02-21 DIAGNOSIS — E559 Vitamin D deficiency, unspecified: Secondary | ICD-10-CM

## 2020-02-21 DIAGNOSIS — Z1329 Encounter for screening for other suspected endocrine disorder: Secondary | ICD-10-CM

## 2020-02-21 DIAGNOSIS — Z1389 Encounter for screening for other disorder: Secondary | ICD-10-CM

## 2020-02-21 DIAGNOSIS — M5126 Other intervertebral disc displacement, lumbar region: Secondary | ICD-10-CM | POA: Insufficient documentation

## 2020-02-21 DIAGNOSIS — I10 Essential (primary) hypertension: Secondary | ICD-10-CM

## 2020-02-21 DIAGNOSIS — Z13818 Encounter for screening for other digestive system disorders: Secondary | ICD-10-CM

## 2020-02-21 DIAGNOSIS — R011 Cardiac murmur, unspecified: Secondary | ICD-10-CM

## 2020-02-21 MED ORDER — LOSARTAN POTASSIUM-HCTZ 50-12.5 MG PO TABS: ORAL_TABLET | ORAL | 3 refills | Status: DC

## 2020-02-21 NOTE — Patient Instructions (Addendum)
Read info below  Drink plenty of water on this fluid pill at least 55 ounces daily  Take care  Hydrochlorothiazide, HCTZ; Losartan Tablets What is this medicine? LOSARTAN; HYDROCHLOROTHIAZIDE (loe SAR tan; hye droe klor oh THYE a zide) is a combination of a diuretic and an angiotensin II receptor blocker. It treats high blood pressure. It may also be used to lower the risk of stroke. This medicine may be used for other purposes; ask your health care provider or pharmacist if you have questions. COMMON BRAND NAME(S): Hyzaar What should I tell my health care provider before I take this medicine? They need to know if you have any of these conditions:  decreased urine  diabetes  kidney disease  liver disease  if you are on a special diet, like a low-salt diet  immune system problems, like lupus  an unusual or allergic reaction to losartan, hydrochlorothiazide, sulfa drugs, other medicines, foods, dyes, or preservatives  pregnant or trying to get pregnant  breast-feeding How should I use this medicine? Take this drug by mouth. Take it as directed on the prescription label at the same time every day. You can take it with or without food. If it upsets your stomach, take it with food. Keep taking it unless your health care provider tells you to stop. Talk to your health care provider about the use of this drug in children. Special care may be needed. Overdosage: If you think you have taken too much of this medicine contact a poison control center or emergency room at once. NOTE: This medicine is only for you. Do not share this medicine with others. What if I miss a dose? If you miss a dose, take it as soon as you can. If it is almost time for your next dose, take only that dose. Do not take double or extra doses. What may interact with this medicine?  barbiturates, like phenobarbital  blood pressure medicines  celecoxib  cimetidine  corticosteroids  diabetic  medicines  diuretics, especially triamterene, spironolactone or amiloride  fluconazole  lithium  NSAIDs, medicines for pain and inflammation, like ibuprofen or naproxen  potassium salts or potassium supplements  prescription pain medicines  rifampin  skeletal muscle relaxants like tubocurarine  some cholesterol-lowering medicines like cholestyramine or colestipol This list may not describe all possible interactions. Give your health care provider a list of all the medicines, herbs, non-prescription drugs, or dietary supplements you use. Also tell them if you smoke, drink alcohol, or use illegal drugs. Some items may interact with your medicine. What should I watch for while using this medicine? Check your blood pressure regularly while you are taking this medicine. Ask your doctor or health care professional what your blood pressure should be and when you should contact him or her. When you check your blood pressure, write down the measurements to show your doctor or health care professional. If you are taking this medicine for a long time, you must visit your health care professional for regular checks on your progress. Make sure you schedule appointments on a regular basis. You must not get dehydrated. Ask your doctor or health care professional how much fluid you need to drink a day. Check with him or her if you get an attack of severe diarrhea, nausea and vomiting, or if you sweat a lot. The loss of too much body fluid can make it dangerous for you to take this medicine. Women should inform their doctor if they wish to become pregnant or think  they might be pregnant. There is a potential for serious side effects to an unborn child, particularly in the second or third trimester. Talk to your health care professional or pharmacist for more information. You may get drowsy or dizzy. Do not drive, use machinery, or do anything that needs mental alertness until you know how this drug affects  you. Do not stand or sit up quickly, especially if you are an older patient. This reduces the risk of dizzy or fainting spells. Alcohol can make you more drowsy and dizzy. Avoid alcoholic drinks. This medicine may increase blood sugar. Ask your healthcare provider if changes in diet or medicines are needed if you have diabetes. Talk to your health care professional about your risk of skin cancer. You may be more at risk for skin cancer if you take this medicine. This medicine can make you more sensitive to the sun. Keep out of the sun. If you cannot avoid being in the sun, wear protective clothing and use sunscreen. Do not use sun lamps or tanning beds/booths. Avoid salt substitutes unless you are told otherwise by your doctor or health care professional. Do not treat yourself for coughs, colds, or pain while you are taking this medicine without asking your doctor or health care professional for advice. Some ingredients may increase your blood pressure. What side effects may I notice from receiving this medicine? Side effects that you should report to your doctor or health care professional as soon as possible:  allergic reactions like skin rash, itching or hives, swelling of the face, lips, or tongue  breathing problems  changes in vision  dark urine  eye pain  fast or irregular heart beat, palpitations, or chest pain  feeling faint or lightheaded  muscle cramps  persistent dry cough  redness, blistering, peeling or loosening of the skin, including inside the mouth   signs and symptoms of high blood sugar such as being more thirsty or hungry or having to urinate more than normal. You may also feel very tired or have blurry vision.  stomach pain  trouble passing urine  unusual bleeding or bruising  worsened gout pain  yellowing of the eyes or skin Side effects that usually do not require medical attention (report to your doctor or health care professional if they continue or  are bothersome):  change in sex drive or performance  headache This list may not describe all possible side effects. Call your doctor for medical advice about side effects. You may report side effects to FDA at 1-800-FDA-1088. Where should I keep my medicine? Keep out of the reach of children and pets. Store at room temperature between 15 and 30 degrees C (59 and 86 degrees F). Protect from light. Keep the container tightly closed. Throw away any unused drug after the expiration date. NOTE: This sheet is a summary. It may not cover all possible information. If you have questions about this medicine, talk to your doctor, pharmacist, or health care provider.  2020 Elsevier/Gold Standard (2018-10-10 12:58:40)  Hypertension, Adult High blood pressure (hypertension) is when the force of blood pumping through the arteries is too strong. The arteries are the blood vessels that carry blood from the heart throughout the body. Hypertension forces the heart to work harder to pump blood and may cause arteries to become narrow or stiff. Untreated or uncontrolled hypertension can cause a heart attack, heart failure, a stroke, kidney disease, and other problems. A blood pressure reading consists of a higher number over a lower  number. Ideally, your blood pressure should be below 120/80. The first ("top") number is called the systolic pressure. It is a measure of the pressure in your arteries as your heart beats. The second ("bottom") number is called the diastolic pressure. It is a measure of the pressure in your arteries as the heart relaxes. What are the causes? The exact cause of this condition is not known. There are some conditions that result in or are related to high blood pressure. What increases the risk? Some risk factors for high blood pressure are under your control. The following factors may make you more likely to develop this condition:  Smoking.  Having type 2 diabetes mellitus, high  cholesterol, or both.  Not getting enough exercise or physical activity.  Being overweight.  Having too much fat, sugar, calories, or salt (sodium) in your diet.  Drinking too much alcohol. Some risk factors for high blood pressure may be difficult or impossible to change. Some of these factors include:  Having chronic kidney disease.  Having a family history of high blood pressure.  Age. Risk increases with age.  Race. You may be at higher risk if you are African American.  Gender. Men are at higher risk than women before age 58. After age 76, women are at higher risk than men.  Having obstructive sleep apnea.  Stress. What are the signs or symptoms? High blood pressure may not cause symptoms. Very high blood pressure (hypertensive crisis) may cause:  Headache.  Anxiety.  Shortness of breath.  Nosebleed.  Nausea and vomiting.  Vision changes.  Severe chest pain.  Seizures. How is this diagnosed? This condition is diagnosed by measuring your blood pressure while you are seated, with your arm resting on a flat surface, your legs uncrossed, and your feet flat on the floor. The cuff of the blood pressure monitor will be placed directly against the skin of your upper arm at the level of your heart. It should be measured at least twice using the same arm. Certain conditions can cause a difference in blood pressure between your right and left arms. Certain factors can cause blood pressure readings to be lower or higher than normal for a short period of time:  When your blood pressure is higher when you are in a health care provider's office than when you are at home, this is called white coat hypertension. Most people with this condition do not need medicines.  When your blood pressure is higher at home than when you are in a health care provider's office, this is called masked hypertension. Most people with this condition may need medicines to control blood pressure. If  you have a high blood pressure reading during one visit or you have normal blood pressure with other risk factors, you may be asked to:  Return on a different day to have your blood pressure checked again.  Monitor your blood pressure at home for 1 week or longer. If you are diagnosed with hypertension, you may have other blood or imaging tests to help your health care provider understand your overall risk for other conditions. How is this treated? This condition is treated by making healthy lifestyle changes, such as eating healthy foods, exercising more, and reducing your alcohol intake. Your health care provider may prescribe medicine if lifestyle changes are not enough to get your blood pressure under control, and if:  Your systolic blood pressure is above 130.  Your diastolic blood pressure is above 80. Your personal target blood  pressure may vary depending on your medical conditions, your age, and other factors. Follow these instructions at home: Eating and drinking   Eat a diet that is high in fiber and potassium, and low in sodium, added sugar, and fat. An example eating plan is called the DASH (Dietary Approaches to Stop Hypertension) diet. To eat this way: ? Eat plenty of fresh fruits and vegetables. Try to fill one half of your plate at each meal with fruits and vegetables. ? Eat whole grains, such as whole-wheat pasta, brown rice, or whole-grain bread. Fill about one fourth of your plate with whole grains. ? Eat or drink low-fat dairy products, such as skim milk or low-fat yogurt. ? Avoid fatty cuts of meat, processed or cured meats, and poultry with skin. Fill about one fourth of your plate with lean proteins, such as fish, chicken without skin, beans, eggs, or tofu. ? Avoid pre-made and processed foods. These tend to be higher in sodium, added sugar, and fat.  Reduce your daily sodium intake. Most people with hypertension should eat less than 1,500 mg of sodium a day.  Do not  drink alcohol if: ? Your health care provider tells you not to drink. ? You are pregnant, may be pregnant, or are planning to become pregnant.  If you drink alcohol: ? Limit how much you use to:  0-1 drink a day for women.  0-2 drinks a day for men. ? Be aware of how much alcohol is in your drink. In the [place] one drink equals one 12 oz bottle of beer (355 mL), one 5 oz glass of wine (148 mL), or one 1 oz glass of hard liquor (44 mL). Lifestyle   Work with your health care provider to maintain a healthy body weight or to lose weight. Ask what an ideal weight is for you.  Get at least 30 minutes of exercise most days of the week. Activities may include walking, swimming, or biking.  Include exercise to strengthen your muscles (resistance exercise), such as Pilates or lifting weights, as part of your weekly exercise routine. Try to do these types of exercises for 30 minutes at least 3 days a week.  Do not use any products that contain nicotine or tobacco, such as cigarettes, e-cigarettes, and chewing tobacco. If you need help quitting, ask your health care provider.  Monitor your blood pressure at home as told by your health care provider.  Keep all follow-up visits as told by your health care provider. This is important. Medicines  Take over-the-counter and prescription medicines only as told by your health care provider. Follow directions carefully. Blood pressure medicines must be taken as prescribed.  Do not skip doses of blood pressure medicine. Doing this puts you at risk for problems and can make the medicine less effective.  Ask your health care provider about side effects or reactions to medicines that you should watch for. Contact a health care provider if you:  Think you are having a reaction to a medicine you are taking.  Have headaches that keep coming back (recurring).  Feel dizzy.  Have swelling in your ankles.  Have trouble with your vision. Get help right  away if you:  Develop a severe headache or confusion.  Have unusual weakness or numbness.  Feel faint.  Have severe pain in your chest or abdomen.  Vomit repeatedly.  Have trouble breathing. Summary  Hypertension is when the force of blood pumping through your arteries is too strong. If this condition  is not controlled, it may put you at risk for serious complications.  Your personal target blood pressure may vary depending on your medical conditions, your age, and other factors. For most people, a normal blood pressure is less than 120/80.  Hypertension is treated with lifestyle changes, medicines, or a combination of both. Lifestyle changes include losing weight, eating a healthy, low-sodium diet, exercising more, and limiting alcohol. This information is not intended to replace advice given to you by your health care provider. Make sure you discuss any questions you have with your health care provider. Document Revised: 10/13/2017 Document Reviewed: 10/13/2017 Elsevier Patient Education  [place].  High Cholesterol  High cholesterol is a condition in which the blood has high levels of a white, waxy, fat-like substance (cholesterol). The human body needs small amounts of cholesterol. The liver makes all the cholesterol that the body needs. Extra (excess) cholesterol comes from the food that we eat. Cholesterol is carried from the liver by the blood through the blood vessels. If you have high cholesterol, deposits (plaques) may build up on the walls of your blood vessels (arteries). Plaques make the arteries narrower and stiffer. Cholesterol plaques increase your risk for heart attack and stroke. Work with your health care provider to keep your cholesterol levels in a healthy range. What increases the risk? This condition is more likely to develop in people who:  Eat foods that are high in animal fat (saturated fat) or cholesterol.  Are overweight.  Are not getting  enough exercise.  Have a family history of high cholesterol. What are the signs or symptoms? There are no symptoms of this condition. How is this diagnosed? This condition may be diagnosed from the results of a blood test.  If you are older than age 33, your health care provider may check your cholesterol every 4-6 years.  You may be checked more often if you already have high cholesterol or other risk factors for heart disease. The blood test for cholesterol measures:  "Bad" cholesterol (LDL cholesterol). This is the main type of cholesterol that causes heart disease. The desired level for LDL is less than 100.  "Good" cholesterol (HDL cholesterol). This type helps to protect against heart disease by cleaning the arteries and carrying the LDL away. The desired level for HDL is 60 or higher.  Triglycerides. These are fats that the body can store or burn for energy. The desired number for triglycerides is lower than 150.  Total cholesterol. This is a measure of the total amount of cholesterol in your blood, including LDL cholesterol, HDL cholesterol, and triglycerides. A healthy number is less than 200. How is this treated? This condition is treated with diet changes, lifestyle changes, and medicines. Diet changes  This may include eating more whole grains, fruits, vegetables, nuts, and fish.  This may also include cutting back on red meat and foods that have a lot of added sugar. Lifestyle changes  Changes may include getting at least 40 minutes of aerobic exercise 3 times a week. Aerobic exercises include walking, biking, and swimming. Aerobic exercise along with a healthy diet can help you maintain a healthy weight.  Changes may also include quitting smoking. Medicines  Medicines are usually given if diet and lifestyle changes have failed to reduce your cholesterol to healthy levels.  Your health care provider may prescribe a statin medicine. Statin medicines have been shown to  reduce cholesterol, which can reduce the risk of heart disease. Follow these instructions at home:  Eating and drinking If told by your health care provider:  Eat chicken (without skin), fish, veal, shellfish, ground [place] breast, and round or loin cuts of red meat.  Do not eat fried foods or fatty meats, such as hot dogs and salami.  Eat plenty of fruits, such as apples.  Eat plenty of vegetables, such as broccoli, potatoes, and carrots.  Eat beans, peas, and lentils.  Eat grains such as barley, rice, couscous, and bulgur wheat.  Eat pasta without cream sauces.  Use skim or nonfat milk, and eat low-fat or nonfat yogurt and cheeses.  Do not eat or drink whole milk, cream, ice cream, egg yolks, or hard cheeses.  Do not eat stick margarine or tub margarines that contain trans fats (also called partially hydrogenated oils).  Do not eat saturated tropical oils, such as coconut oil and palm oil.  Do not eat cakes, cookies, crackers, or other baked goods that contain trans fats.  General instructions  Exercise as directed by your health care provider. Increase your activity level with activities such as gardening, walking, and taking the stairs.  Take over-the-counter and prescription medicines only as told by your health care provider.  Do not use any products that contain nicotine or tobacco, such as cigarettes and e-cigarettes. If you need help quitting, ask your health care provider.  Keep all follow-up visits as told by your health care provider. This is important. Contact a health care provider if:  You are struggling to maintain a healthy diet or weight.  You need help to start on an exercise program.  You need help to stop smoking. Get help right away if:  You have chest pain.  You have trouble breathing. This information is not intended to replace advice given to you by your health care provider. Make sure you discuss any questions you have with your health care  provider. Document Revised: 02/05/2017 Document Reviewed: 08/03/2015 Elsevier Patient Education  [place].  Cholesterol Content in Foods Cholesterol is a waxy, fat-like substance that helps to carry fat in the blood. The body needs cholesterol in small amounts, but too much cholesterol can cause damage to the arteries and heart. Most people should eat less than 200 milligrams (mg) of cholesterol a day. Foods with cholesterol  Cholesterol is found in animal-based foods, such as meat, seafood, and dairy. Generally, low-fat dairy and lean meats have less cholesterol than full-fat dairy and fatty meats. The milligrams of cholesterol per serving (mg per serving) of common cholesterol-containing foods are listed below. Meat and other proteins  Egg - one large whole egg has 186 mg.  Veal shank - 4 oz has 141 mg.  Lean ground [place] (93% lean) - 4 oz has 118 mg.  Fat-trimmed lamb loin - 4 oz has 106 mg.  Lean ground beef (90% lean) - 4 oz has 100 mg.  Lobster - 3.5 oz has 90 mg.  Pork loin chops - 4 oz has 86 mg.  Canned salmon - 3.5 oz has 83 mg.  Fat-trimmed beef top loin - 4 oz has 78 mg.  Frankfurter - 1 frank (3.5 oz) has 77 mg.  Crab - 3.5 oz has 71 mg.  Roasted chicken without skin, white meat - 4 oz has 66 mg.  Light bologna - 2 oz has 45 mg.  Deli-cut [place] - 2 oz has 31 mg.  Canned tuna - 3.5 oz has 31 mg.  Bacon - 1 oz has 29 mg.  Oysters and mussels (raw) -  3.5 oz has 25 mg.  Mackerel - 1 oz has 22 mg.  Trout - 1 oz has 20 mg.  Pork sausage - 1 link (1 oz) has 17 mg.  Salmon - 1 oz has 16 mg.  Tilapia - 1 oz has 14 mg. Dairy  Soft-serve ice cream -  cup (4 oz) has 103 mg.  Whole-milk yogurt - 1 cup (8 oz) has 29 mg.  Cheddar cheese - 1 oz has 28 mg.  American cheese - 1 oz has 28 mg.  Whole milk - 1 cup (8 oz) has 23 mg.  2% milk - 1 cup (8 oz) has 18 mg.  Cream cheese - 1 tablespoon (Tbsp) has 15 mg.  Cottage cheese -  cup (4  oz) has 14 mg.  Low-fat (1%) milk - 1 cup (8 oz) has 10 mg.  Sour cream - 1 Tbsp has 8.5 mg.  Low-fat yogurt - 1 cup (8 oz) has 8 mg.  Nonfat Greek yogurt - 1 cup (8 oz) has 7 mg.  Half-and-half cream - 1 Tbsp has 5 mg. Fats and oils  Cod liver oil - 1 tablespoon (Tbsp) has 82 mg.  Butter - 1 Tbsp has 15 mg.  Lard - 1 Tbsp has 14 mg.  Bacon grease - 1 Tbsp has 14 mg.  Mayonnaise - 1 Tbsp has 5-10 mg.  Margarine - 1 Tbsp has 3-10 mg. Exact amounts of cholesterol in these foods may vary depending on specific ingredients and brands. Foods without cholesterol Most plant-based foods do not have cholesterol unless you combine them with a food that has cholesterol. Foods without cholesterol include:  Grains and cereals.  Vegetables.  Fruits.  Vegetable oils, such as olive, canola, and sunflower oil.  Legumes, such as peas, beans, and lentils.  Nuts and seeds.  Egg whites. Summary  The body needs cholesterol in small amounts, but too much cholesterol can cause damage to the arteries and heart.  Most people should eat less than 200 milligrams (mg) of cholesterol a day. This information is not intended to replace advice given to you by your health care provider. Make sure you discuss any questions you have with your health care provider. Document Revised: 01/15/2017 Document Reviewed: 09/29/2016 Elsevier Patient Education  2020 [employer].  DASH Eating Plan DASH stands for "Dietary Approaches to Stop Hypertension." The DASH eating plan is a healthy eating plan that has been shown to reduce high blood pressure (hypertension). It may also reduce your risk for type 2 diabetes, heart disease, and stroke. The DASH eating plan may also help with weight loss. What are tips for following this plan?  General guidelines  Avoid eating more than 2,300 mg (milligrams) of salt (sodium) a day. If you have hypertension, you may need to reduce your sodium intake to 1,500 mg a  day.  Limit alcohol intake to no more than 1 drink a day for nonpregnant women and 2 drinks a day for men. One drink equals 12 oz of beer, 5 oz of wine, or 1 oz of hard liquor.  Work with your health care provider to maintain a healthy body weight or to lose weight. Ask what an ideal weight is for you.  Get at least 30 minutes of exercise that causes your heart to beat faster (aerobic exercise) most days of the week. Activities may include walking, swimming, or biking.  Work with your health care provider or diet and nutrition specialist (dietitian) to adjust your eating plan to your  individual calorie needs. Reading food labels   Check food labels for the amount of sodium per serving. Choose foods with less than 5 percent of the Daily Value of sodium. Generally, foods with less than 300 mg of sodium per serving fit into this eating plan.  To find whole grains, look for the word "whole" as the first word in the ingredient list. Shopping  Buy products labeled as "low-sodium" or "no salt added."  Buy fresh foods. Avoid canned foods and premade or frozen meals. Cooking  Avoid adding salt when cooking. Use salt-free seasonings or herbs instead of table salt or sea salt. Check with your health care provider or pharmacist before using salt substitutes.  Do not fry foods. Cook foods using healthy methods such as baking, boiling, grilling, and broiling instead.  Cook with heart-healthy oils, such as olive, canola, soybean, or sunflower oil. Meal planning  Eat a balanced diet that includes: ? 5 or more servings of fruits and vegetables each day. At each meal, try to fill half of your plate with fruits and vegetables. ? Up to 6-8 servings of whole grains each day. ? Less than 6 oz of lean meat, poultry, or fish each day. A 3-oz serving of meat is about the same size as a deck of cards. One egg equals 1 oz. ? 2 servings of low-fat dairy each day. ? A serving of nuts, seeds, or beans 5 times  each week. ? Heart-healthy fats. Healthy fats called Omega-3 fatty acids are found in foods such as flaxseeds and coldwater fish, like sardines, salmon, and mackerel.  Limit how much you eat of the following: ? Canned or prepackaged foods. ? Food that is high in trans fat, such as fried foods. ? Food that is high in saturated fat, such as fatty meat. ? Sweets, desserts, sugary drinks, and other foods with added sugar. ? Full-fat dairy products.  Do not salt foods before eating.  Try to eat at least 2 vegetarian meals each week.  Eat more home-cooked food and less restaurant, buffet, and fast food.  When eating at a restaurant, ask that your food be prepared with less salt or no salt, if possible. What foods are recommended? The items listed may not be a complete list. Talk with your dietitian about what dietary choices are best for you. Grains Whole-grain or whole-wheat bread. Whole-grain or whole-wheat pasta. Brown rice. [place]. Bulgur. Whole-grain and low-sodium cereals. Pita bread. Low-fat, low-sodium crackers. Whole-wheat flour tortillas. Vegetables Fresh or frozen vegetables (raw, steamed, roasted, or grilled). Low-sodium or reduced-sodium tomato and vegetable juice. Low-sodium or reduced-sodium tomato sauce and tomato paste. Low-sodium or reduced-sodium canned vegetables. Fruits All fresh, dried, or frozen fruit. Canned fruit in natural juice (without added sugar). Meat and other protein foods Skinless chicken or [place]. Ground chicken or [place]. Pork with fat trimmed off. Fish and seafood. Egg whites. Dried beans, peas, or lentils. Unsalted nuts, nut butters, and seeds. Unsalted canned beans. Lean cuts of beef with fat trimmed off. Low-sodium, lean deli meat. Dairy Low-fat (1%) or fat-free (skim) milk. Fat-free, low-fat, or reduced-fat cheeses. Nonfat, low-sodium ricotta or cottage cheese. Low-fat or nonfat yogurt. Low-fat, low-sodium cheese. Fats and oils Soft margarine  without trans fats. Vegetable oil. Low-fat, reduced-fat, or light mayonnaise and salad dressings (reduced-sodium). Canola, safflower, olive, soybean, and sunflower oils. Avocado. Seasoning and other foods Herbs. Spices. Seasoning mixes without salt. Unsalted popcorn and pretzels. Fat-free sweets. What foods are not recommended? The items listed may not be  a complete list. Talk with your dietitian about what dietary choices are best for you. Grains Baked goods made with fat, such as croissants, muffins, or some breads. Dry pasta or rice meal packs. Vegetables Creamed or fried vegetables. Vegetables in a cheese sauce. Regular canned vegetables (not low-sodium or reduced-sodium). Regular canned tomato sauce and paste (not low-sodium or reduced-sodium). Regular tomato and vegetable juice (not low-sodium or reduced-sodium). Angie Fava. Olives. Fruits Canned fruit in a light or heavy syrup. Fried fruit. Fruit in cream or butter sauce. Meat and other protein foods Fatty cuts of meat. Ribs. Fried meat. Berniece Salines. Sausage. Bologna and other processed lunch meats. Salami. Fatback. Hotdogs. Bratwurst. Salted nuts and seeds. Canned beans with added salt. Canned or smoked fish. Whole eggs or egg yolks. Chicken or [place] with skin. Dairy Whole or 2% milk, cream, and half-and-half. Whole or full-fat cream cheese. Whole-fat or sweetened yogurt. Full-fat cheese. Nondairy creamers. Whipped toppings. Processed cheese and cheese spreads. Fats and oils Butter. Stick margarine. Lard. Shortening. Ghee. Bacon fat. Tropical oils, such as coconut, palm kernel, or palm oil. Seasoning and other foods Salted popcorn and pretzels. Onion salt, garlic salt, seasoned salt, table salt, and sea salt. Worcestershire sauce. Tartar sauce. Barbecue sauce. Teriyaki sauce. Soy sauce, including reduced-sodium. Steak sauce. Canned and packaged gravies. Fish sauce. Oyster sauce. Cocktail sauce. Horseradish that you find on the shelf. Ketchup.  Mustard. Meat flavorings and tenderizers. Bouillon cubes. Hot sauce and Tabasco sauce. Premade or packaged marinades. Premade or packaged taco seasonings. Relishes. Regular salad dressings. Where to find more information:  National Heart, Lung, and [place]: https://Scarber-eaton.com/  American Heart Association: www.heart.org Summary  The DASH eating plan is a healthy eating plan that has been shown to reduce high blood pressure (hypertension). It may also reduce your risk for type 2 diabetes, heart disease, and stroke.  With the DASH eating plan, you should limit salt (sodium) intake to 2,300 mg a day. If you have hypertension, you may need to reduce your sodium intake to 1,500 mg a day.  When on the DASH eating plan, aim to eat more fresh fruits and vegetables, whole grains, lean proteins, low-fat dairy, and heart-healthy fats.  Work with your health care provider or diet and nutrition specialist (dietitian) to adjust your eating plan to your individual calorie needs. This information is not intended to replace advice given to you by your health care provider. Make sure you discuss any questions you have with your health care provider. Document Revised: 01/15/2017 Document Reviewed: 01/27/2016 Elsevier Patient Education  2020 [employer].

## 2020-02-21 NOTE — Progress Notes (Signed)
Chief Complaint  Patient presents with  . New Patient (Initial Visit)    Pt would like to discuss High blood pressure. States it was 140/105 this morning and has ranged in the 160s-140s since November 2021   New patient  1. HTN uncontrolled since 12/2019 per pt around thanksgiving 140s-160s/100s to 100s with episodes of chest pain on and off at rest mid chest mild to moderate w/o associated s'xs and h/a. Diet consists of meat and potatoes. Drinking coffee made chest sx's worse at times and has tried decaff  H/o cardiac murmur since a childhood never seen cardiology   2. He is considering PrEP  Review of Systems  Constitutional: Negative for weight loss.  HENT: Negative for hearing loss.   Eyes: Negative for blurred vision.  Respiratory: Negative for shortness of breath.   Cardiovascular: Positive for chest pain.  Gastrointestinal: Negative for abdominal pain.  Musculoskeletal: Negative for back pain.  Skin: Negative for rash.  Neurological: Positive for headaches.  Psychiatric/Behavioral: Negative for depression. The patient is not nervous/anxious.    Past Medical History:  Diagnosis Date  . Chicken pox   . Hypertension   . RMSF [facility] spotted fever)    as kid   Past Surgical History:  Procedure Laterality Date  . NO PAST SURGERIES     Family History  Adopted: Yes  Family history unknown: Yes   Social History   Socioeconomic History  . Marital status: Married    Spouse name: Not on file  . Number of children: Not on file  . Years of education: Not on file  . Highest education level: Not on file  Occupational History  . Not on file  Tobacco Use  . Smoking status: Former Games developer  . Smokeless tobacco: Never Used  Vaping Use  . Vaping Use: Never used  Substance and Sexual Activity  . Alcohol use: Yes    Alcohol/week: 5.0 standard drinks    Types: 5 Standard drinks or equivalent per week  . Drug use: Not Currently  . Sexual activity: Yes    Partners: Male   Other Topics Concern  . Not on file  Social History Narrative   Partner Dwain Sarna    Works [employer]    Former smoker quit in 2007 smoked x 10 years 1 ppd    Social Determinants of Corporate investment banker Strain: Not on file  Food Insecurity: Not on file  Transportation Needs: Not on file  Physical Activity: Not on file  Stress: Not on file  Social Connections: Not on file  Intimate Partner Violence: Not on file   Current Meds  Medication Sig  . losartan-hydrochlorothiazide (HYZAAR) 50-12.5 MG tablet 1/2 pill in am x 3 days and then increase to 1 pill daily in the am on day 4 for goal BP <130/<80   No Known Allergies No results found for this or any previous visit (from the past 2160 hour(s)). Objective  Body mass index is 28.67 kg/m. Wt Readings from Last 3 Encounters:  02/21/20 208 lb 3.2 oz (94.4 kg)  03/21/19 185 lb (83.9 kg)  12/02/18 185 lb (83.9 kg)   Temp Readings from Last 3 Encounters:  02/21/20 98.7 F (37.1 C)  03/21/19 98.9 F (37.2 C) (Tympanic)  03/01/19 99 F (37.2 C) (Oral)   BP Readings from Last 3 Encounters:  02/21/20 126/82  03/21/19 (!) 137/92  03/01/19 (!) 155/105   Pulse Readings from Last 3 Encounters:  02/21/20 87  03/21/19 (!) 110  03/01/19 87    Physical Exam Vitals and nursing note reviewed.  Constitutional:      Appearance: Normal appearance. He is well-developed, well-groomed and overweight.  HENT:     Head: Normocephalic and atraumatic.  Eyes:     Conjunctiva/sclera: Conjunctivae normal.     Pupils: Pupils are equal, round, and reactive to light.  Cardiovascular:     Rate and Rhythm: Normal rate and regular rhythm.     Heart sounds: Normal heart sounds. No murmur heard.   Pulmonary:     Effort: Pulmonary effort is normal.     Breath sounds: Normal breath sounds.  Skin:    General: Skin is warm and dry.  Neurological:     General: No focal deficit present.     Mental Status: He is alert and oriented to  person, place, and time. Mental status is at baseline.     Gait: Gait normal.  Psychiatric:        Attention and Perception: Attention and perception normal.        Mood and Affect: Mood and affect normal.        Speech: Speech normal.        Behavior: Behavior normal. Behavior is cooperative.        Thought Content: Thought content normal.        Cognition and Memory: Cognition and memory normal.        Judgment: Judgment normal.     Assessment  Plan  Hypertension, ?murmur, chest pain ?angina - Plan: EKG 12-Lead NSR incomplete BBB T wave flattening in AVF V3, V2 no ST elevation, Ambulatory referral to Cardiology BP control  Consider echo, stress test  Losartan hctz 50-12.5 mg 1/2 pill in am x 3 days and then increase to 1 pill daily in the am on day 4 for goal BP <130/<80 Given BP log   Lumbar herniated disc Abnormal MRI, lumbar spine No sxs currently but flares treats with heating pad s/p injection in 2014 [facility] pain [facility]    HM Flu shot utd given today moderna 3/3 Tdap consider in the future   Colonoscopy age 42  psa consider age 89 baseline then routine 45 y.o  Provider: Dr. Olivia Mackie McLean-Scocuzza-Internal Medicine

## 2020-02-23 ENCOUNTER — Other Ambulatory Visit: Payer: Self-pay

## 2020-02-23 ENCOUNTER — Other Ambulatory Visit (INDEPENDENT_AMBULATORY_CARE_PROVIDER_SITE_OTHER): Payer: 59

## 2020-02-23 DIAGNOSIS — E559 Vitamin D deficiency, unspecified: Secondary | ICD-10-CM

## 2020-02-23 DIAGNOSIS — I10 Essential (primary) hypertension: Secondary | ICD-10-CM | POA: Diagnosis not present

## 2020-02-23 DIAGNOSIS — Z1389 Encounter for screening for other disorder: Secondary | ICD-10-CM

## 2020-02-23 DIAGNOSIS — Z1329 Encounter for screening for other suspected endocrine disorder: Secondary | ICD-10-CM | POA: Diagnosis not present

## 2020-02-23 DIAGNOSIS — Z13818 Encounter for screening for other digestive system disorders: Secondary | ICD-10-CM

## 2020-02-23 LAB — COMPREHENSIVE METABOLIC PANEL
ALT: 33 U/L (ref 0–53)
AST: 21 U/L (ref 0–37)
Albumin: 4.9 g/dL (ref 3.5–5.2)
Alkaline Phosphatase: 80 U/L (ref 39–117)
BUN: 16 mg/dL (ref 6–23)
CO2: 28 mEq/L (ref 19–32)
Calcium: 9.3 mg/dL (ref 8.4–10.5)
Chloride: 99 mEq/L (ref 96–112)
Creatinine, Ser: 1.01 mg/dL (ref 0.40–1.50)
GFR: 90.18 mL/min (ref >60.00)
Glucose, Bld: 101 mg/dL — ABNORMAL HIGH (ref 70–99)
Potassium: 3.8 mEq/L (ref 3.5–5.1)
Sodium: 135 mEq/L (ref 135–145)
Total Bilirubin: 1 mg/dL (ref 0.2–1.2)
Total Protein: 7.8 g/dL (ref 6.0–8.3)

## 2020-02-23 LAB — CBC WITH DIFFERENTIAL/PLATELET
Basophils Absolute: 0.1 10*3/uL (ref 0.0–0.1)
Basophils Relative: 1 % (ref 0.0–3.0)
Eosinophils Absolute: 0.2 10*3/uL (ref 0.0–0.7)
Eosinophils Relative: 3 % (ref 0.0–5.0)
HCT: 45.9 % (ref 39.0–52.0)
Hemoglobin: 15.8 g/dL (ref 13.0–17.0)
Lymphocytes Relative: 30.7 % (ref 12.0–46.0)
Lymphs Abs: 1.7 10*3/uL (ref 0.7–4.0)
MCHC: 34.4 g/dL (ref 30.0–36.0)
MCV: 90.1 fl (ref 78.0–100.0)
Monocytes Absolute: 0.5 10*3/uL (ref 0.1–1.0)
Monocytes Relative: 9.6 % (ref 3.0–12.0)
Neutro Abs: 3.2 10*3/uL (ref 1.4–7.7)
Neutrophils Relative %: 55.7 % (ref 43.0–77.0)
Platelets: 202 10*3/uL (ref 150.0–400.0)
RBC: 5.1 Mil/uL (ref 4.22–5.81)
RDW: 13 % (ref 11.5–15.5)
WBC: 5.7 10*3/uL (ref 4.0–10.5)

## 2020-02-23 LAB — LIPID PANEL
Cholesterol: 254 mg/dL — ABNORMAL HIGH (ref 0–200)
HDL: 49.3 mg/dL (ref >39.00)
NonHDL: 205.01
Total CHOL/HDL Ratio: 5
Triglycerides: 294 mg/dL — ABNORMAL HIGH (ref 0.0–149.0)
VLDL: 58.8 mg/dL — ABNORMAL HIGH (ref 0.0–40.0)

## 2020-02-23 LAB — TSH: TSH: 1.28 u[IU]/mL (ref 0.35–4.50)

## 2020-02-23 LAB — VITAMIN D 25 HYDROXY (VIT D DEFICIENCY, FRACTURES): VITD: 47.01 ng/mL (ref 30.00–100.00)

## 2020-02-23 LAB — LDL CHOLESTEROL, DIRECT: Direct LDL: 148 mg/dL

## 2020-02-23 NOTE — Addendum Note (Signed)
Addended by: Leeanne Rio on: 02/23/2020 08:18 AM   Modules accepted: Orders

## 2020-02-24 LAB — URINALYSIS, ROUTINE W REFLEX MICROSCOPIC
Bilirubin, UA: NEGATIVE
Glucose, UA: NEGATIVE
Ketones, UA: NEGATIVE
Leukocytes,UA: NEGATIVE
Nitrite, UA: NEGATIVE
Protein,UA: NEGATIVE
RBC, UA: NEGATIVE
Specific Gravity, UA: 1.007 (ref 1.005–1.030)
Urobilinogen, Ur: 0.2 mg/dL (ref 0.2–1.0)
pH, UA: 7 (ref 5.0–7.5)

## 2020-02-25 DIAGNOSIS — Z87891 Personal history of nicotine dependence: Secondary | ICD-10-CM | POA: Insufficient documentation

## 2020-02-25 DIAGNOSIS — R011 Cardiac murmur, unspecified: Secondary | ICD-10-CM | POA: Insufficient documentation

## 2020-02-25 NOTE — Progress Notes (Signed)
Cardiology Office Note  Date:  02/26/2020   ID:  Nathan Fitzgerald, DOB [birth year], MRN [id number]  PCP:  Fitzgerald, Nathan Glow, MD   Chief Complaint  Patient presents with   New Patient (Initial Visit)    Referred by PCP for HTN, chest pain and Murmur. Chest pain has been since thanksgiving but getting more consistent. Meds reviewed verbally with Patient.     HPI:  [name] is a 45 year old gentleman with past medical history of Hypertension Former smoker Self-reported murmur, Referred by primary care for consultation of his murmur, abnormal EKG, chest pain, hyperlipidemia  Seen by primary care February 21, 2020, Blood pressure elevated Started on losartan HCTZ 50/12.5 mg, been on full dose 1 day Has been on this for several days, has noticed blood pressure starting to trend downwards Started to get some 409 systolics over 90 at home  EKG February 21, 2019, reviewed personally by myself RSR likely normal finding No acute findings  EKG personally reviewed by myself on todays visit NSR rate 73 bpm no significant ST-T wave changes  Having some atypical chest pain, hurts in the center of his chest, even at rest not associate with exertion Lifting heavy packages, as a mailman Does not seem positional  Cholesterol elevated Unknown family history, adopted  PMH:   has a past medical history of Chicken pox, Hypertension, and RMSF [facility] spotted fever).  PSH:    Past Surgical History:  Procedure Laterality Date   NO PAST SURGERIES      Current Outpatient Medications  Medication Sig Dispense Refill   losartan-hydrochlorothiazide (HYZAAR) 50-12.5 MG tablet 1/2 pill in am x 3 days and then increase to 1 pill daily in the am on day 4 for goal BP <130/<80 90 tablet 3   No current facility-administered medications for this visit.     Allergies:   Patient has no known allergies.   Social History:  The patient  reports that he has quit smoking. He has never used  smokeless tobacco. He reports current alcohol use of about 5.0 standard drinks of alcohol per week. He reports previous drug use.   Family History:   He was adopted. Family history is unknown by patient.    Review of Systems: Review of Systems  Constitutional: Negative.   HENT: Negative.   Respiratory: Negative.   Cardiovascular: Negative.   Gastrointestinal: Negative.   Musculoskeletal: Negative.   Neurological: Negative.   Psychiatric/Behavioral: Negative.   All other systems reviewed and are negative.   PHYSICAL EXAM: VS:  BP (!) 142/88 (BP Location: Right Arm, Patient Position: Sitting, Cuff Size: Normal)    Pulse 73    Ht 6' (1.829 m)    Wt 208 lb (94.3 kg)    SpO2 96%    BMI 28.21 kg/m  , BMI Body mass index is 28.21 kg/m. GEN: Well nourished, well developed, in no acute distress HEENT: normal Neck: no JVD, carotid bruits, or masses Cardiac: RRR; no murmurs, rubs, or gallops,no edema  Respiratory:  clear to auscultation bilaterally, normal work of breathing GI: soft, nontender, nondistended, + BS MS: no deformity or atrophy Skin: warm and dry, no rash Neuro:  Strength and sensation are intact Psych: euthymic mood, full affect   Recent Labs: 02/23/2020: ALT 33; BUN 16; Creatinine, Ser 1.01; Hemoglobin 15.8; Platelets 202.0; Potassium 3.8; Sodium 135; TSH 1.28    Lipid Panel Lab Results  Component Value Date   CHOL 254 (H) 02/23/2020   HDL 49.30 02/23/2020  TRIG 294.0 (H) 02/23/2020      Wt Readings from Last 3 Encounters:  02/26/20 208 lb (94.3 kg)  02/21/20 208 lb 3.2 oz (94.4 kg)  03/21/19 185 lb (83.9 kg)       ASSESSMENT AND PLAN:  Problem List Items Addressed This Visit      Cardiology Problems   Hypertension - Primary     Other   Murmur   Relevant Orders   EKG 12-Lead   Former smoker    Other Visit Diagnoses    Chest pain of uncertain etiology       Relevant Orders   EKG 12-Lead   CT CARDIAC SCORING (SELF PAY ONLY)   Mixed  hyperlipidemia         Chest pain Atypical features, recommended ice, NSAIDs Unable to exclude costochondritis, musculoskeletal disorder 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 -CT coronary calcium scoring for risk stratification  Hyperlipidemia Recommended lifestyle modification, dietary changes, workout program once his chest symptoms have improved CT coronary calcium scanning as above for risk ratification For elevated calcium score could consider statin Family history unclear given he is adopted  Murmur No significant murmur on exam, no strong indication for echocardiogram at this time  Abnormal EKG Benign appearing EKG on today's visit Previously noted RSR prime likely benign finding  Essential hypertension Slow improvement in numbers on losartan HCTZ Recommend he continue to monitor at home  Patient seen in consultation for Dr. Marijo File scocuzza will be referred back to her office for ongoing care of the issues detailed above   Total encounter time more than 60 minutes  Greater than 50% was spent in counseling and coordination of care with the patient    Signed, Esmond Plants, M.D., Ph.D. [place], [place]

## 2020-02-26 ENCOUNTER — Encounter: Payer: Self-pay | Admitting: Cardiovascular Disease

## 2020-02-26 ENCOUNTER — Ambulatory Visit: Payer: 59 | Admitting: Cardiovascular Disease

## 2020-02-26 ENCOUNTER — Other Ambulatory Visit: Payer: Self-pay

## 2020-02-26 VITALS — BP 142/88 | HR 73 | Ht 72.0 in | Wt 208.0 lb

## 2020-02-26 DIAGNOSIS — R011 Cardiac murmur, unspecified: Secondary | ICD-10-CM | POA: Diagnosis not present

## 2020-02-26 DIAGNOSIS — I10 Essential (primary) hypertension: Secondary | ICD-10-CM

## 2020-02-26 DIAGNOSIS — Z87891 Personal history of nicotine dependence: Secondary | ICD-10-CM | POA: Diagnosis not present

## 2020-02-26 DIAGNOSIS — R079 Chest pain, unspecified: Secondary | ICD-10-CM

## 2020-02-26 DIAGNOSIS — E782 Mixed hyperlipidemia: Secondary | ICD-10-CM

## 2020-02-26 LAB — HEPATITIS C ANTIBODY
Hepatitis C Ab: NONREACTIVE
SIGNAL TO CUT-OFF: 0.01 (ref <1.00)

## 2020-02-26 NOTE — Patient Instructions (Addendum)
You may apply cold compress (Ice)   Medication Instructions:  Continue your current medications You take NSAIDs for your chest pain   Aspirin (Bayer, Bufferin, Excedrin)  Ibuprofen (Advil, Motrin IB)  Naproxen (Aleve)  Lab work: None   Testing/Procedures: CT coronary calcium score: chest pain  $99 out of pocket expense  at our [facility] in [place]  This procedure uses special x-ray equipment to produce pictures of the coronary arteries to determine if they are blocked or narrowed by the buildup of plaque - an indicator for atherosclerosis or coronary artery disease (CAD).  Please call [phone] to schedule at your earliest convince   [address], [place] [address]   Follow-Up: At [facility], you and your health needs are our priority.  As part of our continuing mission to provide you with exceptional heart care, we have created designated Provider Care Teams.  These Care Teams include your primary Cardiologist (physician) and Advanced Practice Providers (APPs -  Physician Assistants and Nurse Practitioners) who all work together to provide you with the care you need, when you need it.  . You will need a follow up appointment as needed  . Providers on your designated Care Team:   . Murray Hodgkins, NP . Christell Faith, PA-C . Marrianne Mood, PA-C  Any Other Special Instructions Will Be Listed Below (If Applicable).  COVID-19 Vaccine Information can be found at: ShippingScam.co.uk For questions related to vaccine distribution or appointments, please email [email or web address] or call [phone].

## 2020-02-27 ENCOUNTER — Ambulatory Visit: Payer: 59 | Admitting: Internal Medicine

## 2020-02-29 ENCOUNTER — Encounter: Payer: Self-pay | Admitting: Internal Medicine

## 2020-03-08 ENCOUNTER — Other Ambulatory Visit: Payer: Self-pay | Admitting: Internal Medicine

## 2020-03-08 DIAGNOSIS — I10 Essential (primary) hypertension: Secondary | ICD-10-CM

## 2020-03-08 MED ORDER — AMLODIPINE BESYLATE 2.5 MG PO TABS: 2.5000 mg | ORAL_TABLET | Freq: Every day | ORAL | 3 refills | Status: DC

## 2020-03-12 ENCOUNTER — Other Ambulatory Visit: Payer: Self-pay

## 2020-03-12 ENCOUNTER — Encounter: Payer: Self-pay | Admitting: Internal Medicine

## 2020-03-12 ENCOUNTER — Ambulatory Visit: Payer: 59 | Admitting: Internal Medicine

## 2020-03-12 VITALS — BP 138/88 | HR 92 | Temp 98.6°F | Ht 72.0 in | Wt 210.2 lb

## 2020-03-12 DIAGNOSIS — Z0000 Encounter for general adult medical examination without abnormal findings: Secondary | ICD-10-CM | POA: Diagnosis not present

## 2020-03-12 DIAGNOSIS — Z1211 Encounter for screening for malignant neoplasm of colon: Secondary | ICD-10-CM

## 2020-03-12 DIAGNOSIS — G8929 Other chronic pain: Secondary | ICD-10-CM

## 2020-03-12 DIAGNOSIS — I10 Essential (primary) hypertension: Secondary | ICD-10-CM

## 2020-03-12 DIAGNOSIS — M545 Low back pain, unspecified: Secondary | ICD-10-CM | POA: Diagnosis not present

## 2020-03-12 DIAGNOSIS — E663 Overweight: Secondary | ICD-10-CM | POA: Insufficient documentation

## 2020-03-12 NOTE — Progress Notes (Signed)
Chief Complaint  Patient presents with  . Follow-up  . Hypertension   Annual 1. BP improved home log 130s/140s/150s overall trending down with dbp 80s-90s and hr 60s-70s 150/100 small cuff checked and 138/88 large cuff checked  2. Chronic low back pain disc PT   Review of Systems  Constitutional: Negative for weight loss.  HENT: Negative for hearing loss.   Eyes: Negative for blurred vision.  Respiratory: Negative for shortness of breath.   Cardiovascular: Negative for chest pain.  Gastrointestinal: Negative for abdominal pain.  Musculoskeletal: Positive for back pain.  Skin: Negative for rash.  Neurological: Negative for headaches.  Psychiatric/Behavioral: Negative for depression.   Past Medical History:  Diagnosis Date  . Chicken pox   . Hypertension   . [facility] spotted fever)    as kid   Past Surgical History:  Procedure Laterality Date  . NO PAST SURGERIES     Family History  Adopted: Yes  Family history unknown: Yes   Social History   Socioeconomic History  . Marital status: Married    Spouse name: Not on file  . Number of children: Not on file  . Years of education: Not on file  . Highest education level: Not on file  Occupational History  . Not on file  Tobacco Use  . Smoking status: Former Games developer  . Smokeless tobacco: Never Used  Vaping Use  . Vaping Use: Never used  Substance and Sexual Activity  . Alcohol use: Yes    Alcohol/week: 5.0 standard drinks    Types: 5 Standard drinks or equivalent per week  . Drug use: Not Currently  . Sexual activity: Yes    Partners: Male  Other Topics Concern  . Not on file  Social History Narrative   Partner Dwain Sarna    Works mailman    Former smoker quit in 2007 smoked x 10 years 1 ppd    Social Determinants of Corporate investment banker Strain: Not on file  Food Insecurity: Not on file  Transportation Needs: Not on file  Physical Activity: Not on file  Stress: Not on file  Social  Connections: Not on file  Intimate Partner Violence: Not on file   Current Meds  Medication Sig  . amLODipine (NORVASC) 2.5 MG tablet Take 1 tablet (2.5 mg total) by mouth daily.  Marland Kitchen losartan-hydrochlorothiazide (HYZAAR) 50-12.5 MG tablet 1/2 pill in am x 3 days and then increase to 1 pill daily in the am on day 4 for goal BP <130/<80  . POTASSIUM PO Take by mouth.   No Known Allergies Recent Results (from the past 2160 hour(s))  Vitamin D (25 hydroxy)     Status: None   Collection Time: 02/23/20  8:16 AM  Result Value Ref Range   VITD 47.01 30.00 - 100.00 ng/mL  Hepatitis C antibody     Status: None   Collection Time: 02/23/20  8:16 AM  Result Value Ref Range   Hepatitis C Ab NON-REACTIVE NON-REACTI   SIGNAL TO CUT-OFF 0.01 <1.00    Comment: . HCV antibody was non-reactive. There is no laboratory  evidence of HCV infection. . In most cases, no further action is required. However, if recent HCV exposure is suspected, a test for HCV RNA (test code 29528) is suggested. . For additional information please refer to http://education.[facility].com/faq/FAQ22v1 (This link is being provided for informational/ educational purposes only.) .   TSH     Status: None   Collection Time: 02/23/20  8:16  AM  Result Value Ref Range   TSH 1.28 0.35 - 4.50 uIU/mL  CBC with Differential/Platelet     Status: None   Collection Time: 02/23/20  8:16 AM  Result Value Ref Range   WBC 5.7 4.0 - 10.5 K/uL   RBC 5.10 4.22 - 5.81 Mil/uL   Hemoglobin 15.8 13.0 - 17.0 g/dL   HCT 45.9 39.0 - 52.0 %   MCV 90.1 78.0 - 100.0 fl   MCHC 34.4 30.0 - 36.0 g/dL   RDW 13.0 11.5 - 15.5 %   Platelets 202.0 150.0 - 400.0 K/uL   Neutrophils Relative % 55.7 43.0 - 77.0 %   Lymphocytes Relative 30.7 12.0 - 46.0 %   Monocytes Relative 9.6 3.0 - 12.0 %   Eosinophils Relative 3.0 0.0 - 5.0 %   Basophils Relative 1.0 0.0 - 3.0 %   Neutro Abs 3.2 1.4 - 7.7 K/uL   Lymphs Abs 1.7 0.7 - 4.0 K/uL   Monocytes  Absolute 0.5 0.1 - 1.0 K/uL   Eosinophils Absolute 0.2 0.0 - 0.7 K/uL   Basophils Absolute 0.1 0.0 - 0.1 K/uL  Lipid panel     Status: Abnormal   Collection Time: 02/23/20  8:16 AM  Result Value Ref Range   Cholesterol 254 (H) 0 - 200 mg/dL    Comment: ATP III Classification       Desirable:  < 200 mg/dL               Borderline High:  200 - 239 mg/dL          High:  > = 240 mg/dL   Triglycerides 294.0 (H) 0.0 - 149.0 mg/dL    Comment: Normal:  <150 mg/dLBorderline High:  150 - 199 mg/dL   HDL 49.30 >39.00 mg/dL   VLDL 58.8 (H) 0.0 - 40.0 mg/dL   Total CHOL/HDL Ratio 5     Comment:                Men          Women1/2 Average Risk     3.4          3.3Average Risk          5.0          4.42X Average Risk          9.6          7.13X Average Risk          15.0          11.0                       NonHDL 205.01     Comment: NOTE:  Non-HDL goal should be 30 mg/dL higher than patient's LDL goal (i.e. LDL goal of < 70 mg/dL, would have non-HDL goal of < 100 mg/dL)  Comprehensive metabolic panel     Status: Abnormal   Collection Time: 02/23/20  8:16 AM  Result Value Ref Range   Sodium 135 135 - 145 mEq/L   Potassium 3.8 3.5 - 5.1 mEq/L   Chloride 99 96 - 112 mEq/L   CO2 28 19 - 32 mEq/L   Glucose, Bld 101 (H) 70 - 99 mg/dL   BUN 16 6 - 23 mg/dL   Creatinine, Ser 1.01 0.40 - 1.50 mg/dL   Total Bilirubin 1.0 0.2 - 1.2 mg/dL   Alkaline Phosphatase 80 39 - 117 U/L   AST 21 0 - 37 U/L  ALT 33 0 - 53 U/L   Total Protein 7.8 6.0 - 8.3 g/dL   Albumin 4.9 3.5 - 5.2 g/dL   GFR 90.18 >60.00 mL/min    Comment: Calculated using the CKD-EPI Creatinine Equation (2021)   Calcium 9.3 8.4 - 10.5 mg/dL  LDL cholesterol, direct     Status: None   Collection Time: 02/23/20  8:16 AM  Result Value Ref Range   Direct LDL 148.0 mg/dL    Comment: Optimal:  <100 mg/dLNear or Above Optimal:  100-129 mg/dLBorderline High:  130-159 mg/dLHigh:  160-189 mg/dLVery High:  >190 mg/dL  Urinalysis, Routine w reflex  microscopic     Status: None   Collection Time: 02/23/20  8:18 AM  Result Value Ref Range   Specific Gravity, UA 1.007 1.005 - 1.030   pH, UA 7.0 5.0 - 7.5   Color, UA Yellow Yellow   Appearance Ur Clear Clear   Leukocytes,UA Negative Negative   Protein,UA Negative Negative/Trace   Glucose, UA Negative Negative   Ketones, UA Negative Negative   RBC, UA Negative Negative   Bilirubin, UA Negative Negative   Urobilinogen, Ur 0.2 0.2 - 1.0 mg/dL   Nitrite, UA Negative Negative   Microscopic Examination Comment     Comment: Microscopic not indicated and not performed.   Objective  Body mass index is 28.51 kg/m. Wt Readings from Last 3 Encounters:  03/12/20 210 lb 3.2 oz (95.3 kg)  02/26/20 208 lb (94.3 kg)  02/21/20 208 lb 3.2 oz (94.4 kg)   Temp Readings from Last 3 Encounters:  03/12/20 98.6 F (37 C) (Oral)  02/21/20 98.7 F (37.1 C)  03/21/19 98.9 F (37.2 C) (Tympanic)   BP Readings from Last 3 Encounters:  03/12/20 138/88  02/26/20 (!) 142/88  02/21/20 126/82   Pulse Readings from Last 3 Encounters:  03/12/20 92  02/26/20 73  02/21/20 87    Physical Exam Vitals and nursing note reviewed.  Constitutional:      Appearance: Normal appearance. He is well-developed, well-groomed and overweight.  HENT:     Head: Normocephalic and atraumatic.  Eyes:     Conjunctiva/sclera: Conjunctivae normal.     Pupils: Pupils are equal, round, and reactive to light.  Cardiovascular:     Rate and Rhythm: Normal rate and regular rhythm.     Heart sounds: Normal heart sounds. No murmur heard.   Pulmonary:     Effort: Pulmonary effort is normal.     Breath sounds: Normal breath sounds.  Skin:    General: Skin is warm and dry.  Neurological:     General: No focal deficit present.     Mental Status: He is alert and oriented to person, place, and time. Mental status is at baseline.     Gait: Gait normal.  Psychiatric:        Attention and Perception: Attention and perception  normal.        Mood and Affect: Mood and affect normal.        Speech: Speech normal.        Behavior: Behavior normal. Behavior is cooperative.        Thought Content: Thought content normal.        Cognition and Memory: Cognition and memory normal.        Judgment: Judgment normal.     Assessment  Plan  Annual physical exam -  Flu shot utd moderna 3/3 Tdap consider in the future  Hep C negative  Colonoscopy age 3 refer  [facility]  psa consider age 56 baseline then routine 45 y.o rec healthy diet and exercise  Derm-consider in future   Hypertension, improved - Plan: Basic Metabolic Panel (BMET) Monitor BP improved  Increase norvasc to 5 mg 03/15/20 and my chart Friday hyzaar 50-12.5 my chart me and if still elevated will increase 5 mg pill and cont hyzaar as is by 03/22/20   Chronic low back pain, unspecified back pain laterality, unspecified whether sciatica present Consider pt   Overweight (BMI 25.0-29.9)  Wants to consider adipex when BP controlled Provider: Dr. French Ana McLean-Scocuzza-[facility]

## 2020-03-12 NOTE — Patient Instructions (Addendum)
Take 2 norvasc 2.5 daily starting 03/15/20 and let me know 03/22/20 as my chart message   Let me know [facility] PT or [facility]   Green chef meals    Phentermine tablets or capsules What is this medicine? PHENTERMINE (FEN ter meen) decreases your appetite. It is used with a reduced calorie diet and exercise to help you lose weight. This medicine may be used for other purposes; ask your health care provider or pharmacist if you have questions. COMMON BRAND NAME(S): Adipex-P, Atti-Plex P, Atti-Plex P Spansule, Fastin, Lomaira, Pro-Fast, Tara-8 What should I tell my health care provider before I take this medicine? They need to know if you have any of these conditions:  agitation or nervousness  diabetes  glaucoma  heart disease  high blood pressure  history of drug abuse or addiction  history of stroke  kidney disease  lung disease called Primary Pulmonary Hypertension (PPH)  taken an MAOI like Carbex, Eldepryl, Marplan, Nardil, or Parnate in last 14 days  taking stimulant medicines for attention disorders, weight loss, or to stay awake  thyroid disease  an unusual or allergic reaction to phentermine, other medicines, foods, dyes, or preservatives  pregnant or trying to get pregnant  breast-feeding How should I use this medicine? Take this medicine by mouth with a glass of water. Follow the directions on the prescription label. Take your medicine at regular intervals. Do not take it more often than directed. Do not stop taking except on your doctor's advice. Talk to your pediatrician regarding the use of this medicine in children. While this drug may be prescribed for children 17 years or older for selected conditions, precautions do apply. Overdosage: If you think you have taken too much of this medicine contact a poison control center or emergency room at once. NOTE: This medicine is only for you. Do not share this medicine with others. What if I miss a dose? If you miss a  dose, take it as soon as you can. If it is almost time for your next dose, take only that dose. Do not take double or extra doses. What may interact with this medicine? Do not take this medicine with any of the following medications:  MAOIs like Carbex, Eldepryl, Marplan, Nardil, and Parnate This medicine may also interact with the following medications:  alcohol  certain medicines for depression, anxiety, or psychotic disorders  certain medicines for high blood pressure  linezolid  medicines for colds or breathing difficulties like pseudoephedrine or phenylephrine  medicines for diabetes  sibutramine  stimulant medicines for attention disorders, weight loss, or to stay awake This list may not describe all possible interactions. Give your health care provider a list of all the medicines, herbs, non-prescription drugs, or dietary supplements you use. Also tell them if you smoke, drink alcohol, or use illegal drugs. Some items may interact with your medicine. What should I watch for while using this medicine? Visit your doctor or health care provider for regular checks on your progress. Do not stop taking except on your health care provider's advice. You may develop a severe reaction. Your health care provider will tell you how much medicine to take. Do not take this medicine close to bedtime. It may prevent you from sleeping. You may get drowsy or dizzy. Do not drive, use machinery, or do anything that needs mental alertness until you know how this medicine affects you. Do not stand or sit up quickly, especially if you are an older patient. This reduces the  risk of dizzy or fainting spells. Alcohol may increase dizziness and drowsiness. Avoid alcoholic drinks. This medicine may affect blood sugar levels. Ask your healthcare provider if changes in diet or medicines are needed if you have diabetes. Women should inform their health care provider if they wish to become pregnant or think they  might be pregnant. Losing weight while pregnant is not advised and may cause harm to the unborn child. Talk to your health care provider for more information. What side effects may I notice from receiving this medicine? Side effects that you should report to your doctor or health care professional as soon as possible:  allergic reactions like skin rash, itching or hives, swelling of the face, lips, or tongue  breathing problems  changes in emotions or moods  changes in vision  chest pain or chest tightness  fast, irregular heartbeat  feeling faint or lightheaded  increased blood pressure  irritable  restlessness  tremors  seizures  signs and symptoms of a stroke like changes in vision; confusion; trouble speaking or understanding; severe headaches; sudden numbness or weakness of the face, arm or leg; trouble walking; dizziness; loss of balance or coordination  unusually weak or tired Side effects that usually do not require medical attention (report to your doctor or health care professional if they continue or are bothersome):  changes in taste  constipation or diarrhea  dizziness  dry mouth  headache  trouble sleeping  upset stomach This list may not describe all possible side effects. Call your doctor for medical advice about side effects. You may report side effects to FDA at 1-800-FDA-1088. Where should I keep my medicine? Keep out of the reach of children. This medicine can be abused. Keep your medicine in a safe place to protect it from theft. Do not share this medicine with anyone. Selling or giving away this medicine is dangerous and against the law. This medicine may cause harm and death if it is taken by other adults, children, or pets. Return medicine that has not been used to an official disposal site. Contact the DEA at 540-885-0248 or your city/county government to find a site. If you cannot return the medicine, mix any unused medicine with a  substance like cat litter or coffee grounds. Then throw the medicine away in a sealed container like a sealed bag or coffee can with a lid. Do not use the medicine after the expiration date. Store at room temperature between 20 and 25 degrees C (68 and 77 degrees F). Keep container tightly closed. NOTE: This sheet is a summary. It may not cover all possible information. If you have questions about this medicine, talk to your doctor, pharmacist, or health care provider.  2021 Elsevier/Gold Standard (2018-12-09 12:54:20)   High Cholesterol  High cholesterol is a condition in which the blood has high levels of a white, waxy substance similar to fat (cholesterol). The liver makes all the cholesterol that the body needs. The human body needs small amounts of cholesterol to help build cells. A person gets extra or excess cholesterol from the food that he or she eats. The blood carries cholesterol from the liver to the rest of the body. If you have high cholesterol, deposits (plaques) may build up on the walls of your arteries. Arteries are the blood vessels that carry blood away from your heart. These plaques make the arteries narrow and stiff. Cholesterol plaques increase your risk for heart attack and stroke. Work with your health care provider to keep  your cholesterol levels in a healthy range. What increases the risk? The following factors may make you more likely to develop this condition:  Eating foods that are high in animal fat (saturated fat) or cholesterol.  Being overweight.  Not getting enough exercise.  A family history of high cholesterol (familial hypercholesterolemia).  Use of tobacco products.  Having diabetes. What are the signs or symptoms? There are no symptoms of this condition. How is this diagnosed? This condition may be diagnosed based on the results of a blood test.  If you are older than 45 years of age, your health care provider may check your cholesterol levels  every 4-6 years.  You may be checked more often if you have high cholesterol or other risk factors for heart disease. The blood test for cholesterol measures:  "Bad" cholesterol, or LDL cholesterol. This is the main type of cholesterol that causes heart disease. The desired level is less than 100 mg/dL.  "Good" cholesterol, or HDL cholesterol. HDL helps protect against heart disease by cleaning the arteries and carrying the LDL to the liver for processing. The desired level for HDL is 60 mg/dL or higher.  Triglycerides. These are fats that your body can store or burn for energy. The desired level is less than 150 mg/dL.  Total cholesterol. This measures the total amount of cholesterol in your blood and includes LDL, HDL, and triglycerides. The desired level is less than 200 mg/dL. How is this treated? This condition may be treated with:  Diet changes. You may be asked to eat foods that have more fiber and less saturated fats or added sugar.  Lifestyle changes. These may include regular exercise, maintaining a healthy weight, and quitting use of tobacco products.  Medicines. These are given when diet and lifestyle changes have not worked. You may be prescribed a statin medicine to help lower your cholesterol levels. Follow these instructions at home: Eating and drinking  Eat a healthy, balanced diet. This diet includes: ? Daily servings of a variety of fresh, frozen, or canned fruits and vegetables. ? Daily servings of whole grain foods that are rich in fiber. ? Foods that are low in saturated fats and trans fats. These include poultry and fish without skin, lean cuts of meat, and low-fat dairy products. ? A variety of fish, especially oily fish that contain omega-3 fatty acids. Aim to eat fish at least 2 times a week.  Avoid foods and drinks that have added sugar.  Use healthy cooking methods, such as roasting, grilling, broiling, baking, poaching, steaming, and stir-frying. Do not fry  your food except for stir-frying.   Lifestyle  Get regular exercise. Aim to exercise for a total of 150 minutes a week. Increase your activity level by doing activities such as gardening, walking, and taking the stairs.  Do not use any products that contain nicotine or tobacco, such as cigarettes, e-cigarettes, and chewing tobacco. If you need help quitting, ask your health care provider.   General instructions  Take over-the-counter and prescription medicines only as told by your health care provider.  Keep all follow-up visits as told by your health care provider. This is important. Where to find more information  American Heart Association: www.heart.org  National Heart, Lung, and Blood Institute: https://[place].com/ Contact a health care provider if:  You have trouble achieving or maintaining a healthy diet or weight.  You are starting an exercise program.  You are unable to stop smoking. Get help right away if:  You have  chest pain.  You have trouble breathing.  You have any symptoms of a stroke. "BE FAST" is an easy way to remember the main warning signs of a stroke: ? B - Balance. Signs are dizziness, sudden trouble walking, or loss of balance. ? E - Eyes. Signs are trouble seeing or a sudden change in vision. ? F - Face. Signs are sudden weakness or numbness of the face, or the face or eyelid drooping on one side. ? A - Arms. Signs are weakness or numbness in an arm. This happens suddenly and usually on one side of the body. ? S - Speech. Signs are sudden trouble speaking, slurred speech, or trouble understanding what people say. ? T - Time. Time to call emergency services. Write down what time symptoms started.  You have other signs of a stroke, such as: ? A sudden, severe headache with no known cause. ? Nausea or vomiting. ? Seizure. These symptoms may represent a serious problem that is an emergency. Do not wait to see if the symptoms will go away. Get medical help  right away. Call your local emergency services (911 in the [place] Do not drive yourself to the hospital. Summary  Cholesterol plaques increase your risk for heart attack and stroke. Work with your health care provider to keep your cholesterol levels in a healthy range.  Eat a healthy, balanced diet, get regular exercise, and maintain a healthy weight.  Do not use any products that contain nicotine or tobacco, such as cigarettes, e-cigarettes, and chewing tobacco.  Get help right away if you have any symptoms of a stroke. This information is not intended to replace advice given to you by your health care provider. Make sure you discuss any questions you have with your health care provider. Document Revised: 01/02/2019 Document Reviewed: 01/02/2019 Elsevier Patient Education  2021 [place].  Cholesterol Content in Foods Cholesterol is a waxy, fat-like substance that helps to carry fat in the blood. The body needs cholesterol in small amounts, but too much cholesterol can cause damage to the arteries and heart. Most people should eat less than 200 milligrams (mg) of cholesterol a day. Foods with cholesterol Cholesterol is found in animal-based foods, such as meat, seafood, and dairy. Generally, low-fat dairy and lean meats have less cholesterol than full-fat dairy and fatty meats. The milligrams of cholesterol per serving (mg per serving) of common cholesterol-containing foods are listed below. Meat and other proteins  Egg - one large whole egg has 186 mg.  Veal shank - 4 oz has 141 mg.  Lean ground [place] (93% lean) - 4 oz has 118 mg.  Fat-trimmed lamb loin - 4 oz has 106 mg.  Lean ground beef (90% lean) - 4 oz has 100 mg.  Lobster - 3.5 oz has 90 mg.  Pork loin chops - 4 oz has 86 mg.  Canned salmon - 3.5 oz has 83 mg.  Fat-trimmed beef top loin - 4 oz has 78 mg.  Frankfurter - 1 frank (3.5 oz) has 77 mg.  Crab - 3.5 oz has 71 mg.  Roasted chicken without skin, white  meat - 4 oz has 66 mg.  Light bologna - 2 oz has 45 mg.  Deli-cut [place] - 2 oz has 31 mg.  Canned tuna - 3.5 oz has 31 mg.  Bacon - 1 oz has 29 mg.  Oysters and mussels (raw) - 3.5 oz has 25 mg.  Mackerel - 1 oz has 22 mg.  Trout - 1  oz has 20 mg.  Pork sausage - 1 link (1 oz) has 17 mg.  Salmon - 1 oz has 16 mg.  Tilapia - 1 oz has 14 mg. Dairy  Soft-serve ice cream -  cup (4 oz) has 103 mg.  Whole-milk yogurt - 1 cup (8 oz) has 29 mg.  Cheddar cheese - 1 oz has 28 mg.  American cheese - 1 oz has 28 mg.  Whole milk - 1 cup (8 oz) has 23 mg.  2% milk - 1 cup (8 oz) has 18 mg.  Cream cheese - 1 tablespoon (Tbsp) has 15 mg.  Cottage cheese -  cup (4 oz) has 14 mg.  Low-fat (1%) milk - 1 cup (8 oz) has 10 mg.  Sour cream - 1 Tbsp has 8.5 mg.  Low-fat yogurt - 1 cup (8 oz) has 8 mg.  Nonfat Greek yogurt - 1 cup (8 oz) has 7 mg.  Half-and-half cream - 1 Tbsp has 5 mg. Fats and oils  Cod liver oil - 1 tablespoon (Tbsp) has 82 mg.  Butter - 1 Tbsp has 15 mg.  Lard - 1 Tbsp has 14 mg.  Bacon grease - 1 Tbsp has 14 mg.  Mayonnaise - 1 Tbsp has 5-10 mg.  Margarine - 1 Tbsp has 3-10 mg. Exact amounts of cholesterol in these foods may vary depending on specific ingredients and brands.   Foods without cholesterol Most plant-based foods do not have cholesterol unless you combine them with a food that has cholesterol. Foods without cholesterol include:  Grains and cereals.  Vegetables.  Fruits.  Vegetable oils, such as olive, canola, and sunflower oil.  Legumes, such as peas, beans, and lentils.  Nuts and seeds.  Egg whites.   Summary  The body needs cholesterol in small amounts, but too much cholesterol can cause damage to the arteries and heart.  Most people should eat less than 200 milligrams (mg) of cholesterol a day. This information is not intended to replace advice given to you by your health care provider. Make sure you discuss any  questions you have with your health care provider. Document Revised: 06/26/2019 Document Reviewed: 06/26/2019 Elsevier Patient Education  [place].

## 2020-03-13 LAB — BASIC METABOLIC PANEL
BUN: 17 mg/dL (ref 6–23)
CO2: 28 mEq/L (ref 19–32)
Calcium: 9.9 mg/dL (ref 8.4–10.5)
Chloride: 98 mEq/L (ref 96–112)
Creatinine, Ser: 0.89 mg/dL (ref 0.40–1.50)
GFR: 103.87 mL/min (ref >60.00)
Glucose, Bld: 87 mg/dL (ref 70–99)
Potassium: 3.8 mEq/L (ref 3.5–5.1)
Sodium: 135 mEq/L (ref 135–145)

## 2020-04-23 ENCOUNTER — Other Ambulatory Visit: Payer: Self-pay | Admitting: Internal Medicine

## 2020-04-23 DIAGNOSIS — I10 Essential (primary) hypertension: Secondary | ICD-10-CM

## 2020-04-23 MED ORDER — LOSARTAN POTASSIUM-HCTZ 50-12.5 MG PO TABS: ORAL_TABLET | ORAL | 3 refills | Status: DC

## 2020-04-23 MED ORDER — AMLODIPINE BESYLATE 5 MG PO TABS
5.0000 mg | ORAL_TABLET | Freq: Every day | ORAL | 3 refills | Status: DC
Start: 2020-04-23 — End: 2021-02-20

## 2020-04-23 NOTE — Telephone Encounter (Signed)
Pt is changing pharmacies from [facility] to [facility]. Please send medication refills there and also the update on the blood pressure medication.  Norvasc ???

## 2020-06-11 ENCOUNTER — Ambulatory Visit: Payer: 59 | Admitting: Internal Medicine

## 2020-06-21 ENCOUNTER — Encounter: Payer: Self-pay | Admitting: Internal Medicine

## 2020-06-21 NOTE — Telephone Encounter (Signed)
Spoke with pt and scheduled him for a virtual visit with Sharyn Lull on Monday. He stated that he is not having any trouble breathing, no fever, no cough or congestion. Just stuffy nose and scratchy throat.

## 2020-06-24 ENCOUNTER — Telehealth: Payer: 59 | Admitting: Adult Health

## 2020-06-24 ENCOUNTER — Encounter: Payer: Self-pay | Admitting: Adult Health

## 2020-06-24 VITALS — Ht 72.01 in | Wt 210.0 lb

## 2020-06-24 DIAGNOSIS — J302 Other seasonal allergic rhinitis: Secondary | ICD-10-CM | POA: Diagnosis not present

## 2020-06-24 DIAGNOSIS — R0981 Nasal congestion: Secondary | ICD-10-CM | POA: Diagnosis not present

## 2020-06-24 DIAGNOSIS — R6889 Other general symptoms and signs: Secondary | ICD-10-CM

## 2020-06-24 MED ORDER — FLUTICASONE PROPIONATE 50 MCG/ACT NA SUSP
2.0000 | Freq: Every day | NASAL | 2 refills | Status: DC
Start: 2020-06-24 — End: 2021-06-04

## 2020-06-24 MED ORDER — PREDNISONE 10 MG (21) PO TBPK: ORAL_TABLET | ORAL | 0 refills | Status: DC

## 2020-06-24 MED ORDER — LEVOCETIRIZINE DIHYDROCHLORIDE 5 MG PO TABS: 5.0000 mg | ORAL_TABLET | Freq: Every evening | ORAL | 1 refills | Status: DC

## 2020-06-24 NOTE — Progress Notes (Addendum)
Virtual Visit via Telephone Note  I connected with Nathan Fitzgerald on 06/24/20 at  8:00 AM EDT by telephone and verified that I am speaking with the correct person using two identifiers. Parties involved in visit as below:   Location: Patient: at home  Provider: Provider: Provider's office at  [place], [place].      I discussed the limitations, risks, security and privacy concerns of performing an evaluation and management service by telephone and the availability of in person appointments. I also discussed with the patient that there may be a patient responsible charge related to this service. The patient expressed understanding and agreed to proceed.   History of Present Illness: Patient has had ongoing nasal congestion, had mild sore throat.  Nasal congestion and pressure - using nasal spray Afrin. 1-2 times at day and night for one month. Clear nasal drainage.  He has been on zyrtec for one month.  Was negative for Covid he reports.  Patient  denies any fever, body aches,chills, rash, chest pain, shortness of breath, nausea, vomiting, or diarrhea.    He also reports after age 50 he has decreased riding motorcycle, mowing yard and wanted to consider having. He denies any sexual issues. He would like to have his testosterone checked. Denies depression or anxiety. Denies any suicidal or homicidal ideations or intents.  Denies dizziness, lightheadedness, pre syncopal or syncopal episodes.   Observations/Objective:   Patient is alert and oriented and responsive to questions Engages in conversation with provider. Speaks in full sentences without any pauses without any shortness of breath or distress.   Assessment and Plan:  1. Nasal congestion  - predniSONE (STERAPRED UNI-PAK 21 TAB) 10 MG (21) TBPK tablet; PO: Take 6 tablets on day 1:Take 5 tablets day 2:Take 4 tablets day 3: Take 3 tablets day 4:Take 2 tablets day five: 5 Take 1 tablet day 6   Dispense: 21 tablet; Refill: 0 - fluticasone (FLONASE) 50 MCG/ACT nasal spray; Place 2 sprays into both nostrils daily.  Dispense: 15.8 mL; Refill: 2 - levocetirizine (XYZAL) 5 MG tablet; Take 1 tablet (5 mg total) by mouth every evening.  Dispense: 90 tablet; Refill: 1  2. Seasonal allergies As above will stop Zyrtec and start the above advised stopping Afrin nose spray and the importance of only using it one to 3 days and reasoning given to patient for this.  3. Lack of interest Advised him to follow up with PCP if this persist. He understands that his testosterone should  - Testosterone,Free and Total; Future  Follow Up Instructions:    I discussed the assessment and treatment plan with the patient. The patient was provided an opportunity to ask questions and all were answered. The patient agreed with the plan and demonstrated an understanding of the instructions.   The patient was advised to call back or seek an in-person evaluation if the symptoms worsen or if the condition fails to improve as anticipated.  Advised in person evaluation at anytime is advised if any symptoms do not improve, worsen or change at any given time.  Red Flags discussed. The patient was given clear instructions to go to ER or return to medical center if any red flags develop, symptoms do not improve, worsen or new problems develop. They verbalized understanding.  Addressed acute medical problems today requiring 30 minutes reviewing his medical record, counseling patient regarding his conditions and coordination of care.   Marcille Buffy, FNP

## 2020-12-11 ENCOUNTER — Other Ambulatory Visit: Payer: Self-pay | Admitting: Adult Health

## 2020-12-11 DIAGNOSIS — R0981 Nasal congestion: Secondary | ICD-10-CM

## 2020-12-16 ENCOUNTER — Encounter: Payer: Self-pay | Admitting: Internal Medicine

## 2020-12-17 NOTE — Telephone Encounter (Signed)
Please advise, does Patient need to be seen or okay for referral?

## 2020-12-23 NOTE — Addendum Note (Signed)
Addended by: Orland Mustard on: 12/23/2020 02:08 AM   Modules accepted: Orders

## 2021-01-01 DIAGNOSIS — M5416 Radiculopathy, lumbar region: Secondary | ICD-10-CM | POA: Insufficient documentation

## 2021-01-01 DIAGNOSIS — R52 Pain, unspecified: Secondary | ICD-10-CM | POA: Insufficient documentation

## 2021-01-08 ENCOUNTER — Other Ambulatory Visit: Payer: Self-pay | Admitting: Adult Health

## 2021-01-08 DIAGNOSIS — R0981 Nasal congestion: Secondary | ICD-10-CM

## 2021-02-08 ENCOUNTER — Other Ambulatory Visit: Payer: Self-pay | Admitting: Adult Health

## 2021-02-08 DIAGNOSIS — R0981 Nasal congestion: Secondary | ICD-10-CM

## 2021-02-12 ENCOUNTER — Encounter: Payer: Self-pay | Admitting: Internal Medicine

## 2021-02-12 NOTE — Telephone Encounter (Signed)
Please advise  does Patient need to be seen?

## 2021-02-20 ENCOUNTER — Encounter: Payer: Self-pay | Admitting: Internal Medicine

## 2021-02-20 ENCOUNTER — Ambulatory Visit (INDEPENDENT_AMBULATORY_CARE_PROVIDER_SITE_OTHER): Payer: 59 | Admitting: Internal Medicine

## 2021-02-20 ENCOUNTER — Other Ambulatory Visit: Payer: Self-pay

## 2021-02-20 VITALS — BP 160/84 | HR 75 | Temp 98.0°F | Ht 72.01 in | Wt 207.0 lb

## 2021-02-20 DIAGNOSIS — H00014 Hordeolum externum left upper eyelid: Secondary | ICD-10-CM

## 2021-02-20 DIAGNOSIS — F4323 Adjustment disorder with mixed anxiety and depressed mood: Secondary | ICD-10-CM | POA: Insufficient documentation

## 2021-02-20 DIAGNOSIS — Z23 Encounter for immunization: Secondary | ICD-10-CM | POA: Diagnosis not present

## 2021-02-20 DIAGNOSIS — Z125 Encounter for screening for malignant neoplasm of prostate: Secondary | ICD-10-CM

## 2021-02-20 DIAGNOSIS — I10 Essential (primary) hypertension: Secondary | ICD-10-CM

## 2021-02-20 DIAGNOSIS — G4700 Insomnia, unspecified: Secondary | ICD-10-CM | POA: Diagnosis not present

## 2021-02-20 DIAGNOSIS — F1010 Alcohol abuse, uncomplicated: Secondary | ICD-10-CM | POA: Diagnosis not present

## 2021-02-20 DIAGNOSIS — R5383 Other fatigue: Secondary | ICD-10-CM

## 2021-02-20 DIAGNOSIS — E538 Deficiency of other specified B group vitamins: Secondary | ICD-10-CM

## 2021-02-20 MED ORDER — LOSARTAN POTASSIUM-HCTZ 100-12.5 MG PO TABS: 1.0000 | ORAL_TABLET | Freq: Every day | ORAL | 3 refills | Status: DC

## 2021-02-20 MED ORDER — BUPROPION HCL ER (XL) 150 MG PO TB24: 150.0000 mg | ORAL_TABLET | Freq: Every day | ORAL | 3 refills | Status: DC

## 2021-02-20 MED ORDER — AMLODIPINE BESYLATE 5 MG PO TABS: 5.0000 mg | ORAL_TABLET | Freq: Every day | ORAL | 3 refills | Status: DC

## 2021-02-20 MED ORDER — HYDROXYZINE HCL 25 MG PO TABS: 25.0000 mg | ORAL_TABLET | Freq: Every evening | ORAL | 1 refills | Status: DC | PRN

## 2021-02-20 NOTE — Patient Instructions (Addendum)
[facility] [place]  [place] [phone]    [facility]  [address]  [place] [address]  [phone] Therapist ask for amy murphy In 1 month call for psychiatry appointment if not better   REFRAME app to stop drinking  Call above for therapy and psychiatry   [facility]     Phone Fax E-mail Address  [phone] Not available [address]    [place] [address]     Specialties        Binge-Drinking Information, Adult Binge-drinking refers to drinking a large amount of alcohol in a short time. This is usually 5 drinks for men or 4 drinks for women, on one occasion. People who binge-drink do not always have an alcohol problem. However, binge-drinking can raise your risk of becoming dependent on alcohol (having alcohol use disorder). In addition to health problems, such as heart disease, liver disease, or cancer, binge-drinking can also lead to legal, financial, and interpersonal problems. Friends and family may notice signs of binge-drinking or alcohol use disorder before you do. What lifestyle changes can be made?   Avoid drinking too much. If you choose to drink, drink slowly, set a limit for yourself, and follow that plan. Aim for a limit of 1 drink a day for nonpregnant women and 2 drinks a day for men. One drink equals 12 oz of beer, 5 oz of wine, or 1 oz of hard liquor. Encourage others around you not to binge-drink. Become aware of situations and people who trigger your drinking behavior, and either avoid those or find a new way to deal with them. Find hobbies that you can do instead of drinking, such as exercising or outdoor activities. Do not drink if: You are pregnant or trying to become pregnant. You plan to drive a vehicle. You plan to use machinery, such as a Conservation officer, nature or power tool. You have a health condition that  alcohol can make worse. You take medicines that are affected by alcohol, including prescription pain medicines. You are recovering from alcohol use disorder. Develop skills to manage your moods and emotions so you do not need to drink to cope with them. This may include using stress reduction techniques such as: Deep breathing. Meditation or yoga. Exercise or playing sports. Keeping a stress diary. Listening to music. Why are these changes important? Binge-drinking can put you at risk for serious health problems, including: Accidental injuries, such as alcohol poisoning or falls. Violence, such as sexual assault. STDs (sexually transmitted diseases). Developing alcohol use disorder. Mental health problems, such as anxiety or depression. Problems with memory or learning. Liver disease. High blood pressure. Heart disease. Stroke. Cancer of the liver, colon, esophagus, breast, or mouth. If you binge-drink while pregnant, your baby may also be at risk for health problems, including: Miscarriage. Stillbirth. Fetal alcohol spectrum disorders (FASDs). Sudden infant death syndrome (SIDS). What can happen if changes are not made? In addition to health problems, binge-drinking can also raise your risk of: Car accidents. Problems with relationships and other social situations. Legal or financial problems. Unplanned pregnancies. What are the benefits of controlling my drinking? Controlling your drinking or quitting drinking can make you feel better and: Help you control your weight. Make you more likely to get into good physical shape and stay fit. Improve how your body processes vitamins and minerals. Improve your health by lowering your blood pressure, cholesterol, triglycerides,  and blood sugar (glucose). Help you think more clearly and make better decisions. Where to find support: You can get support to stop binge-drinking from: Your health care provider. He or she may be able to  recommend counseling if you drink too much. The National Drug and Alcohol Treatment Referral Service: 7-035-009-FGHW (2993) Alcoholics Anonymous, [place]: 210-242-6837 Where to find more information: You can find more information about binge-drinking from: Substance Abuse and [place]: ktimeonline.com [place]: www.alcoholicsanonymous.com Contact a health care provider if: You cannot control your drinking, or you think that your drinking might be out of your control. You have unexpected physical problems that cause you distress, such as accidental injuries. You need help with the consequences of your drinking. Get help right away if: You have thoughts about hurting yourself or others. If you ever feel like you may hurt yourself or others, or have thoughts about taking your own life, get help right away. You can go to your nearest emergency department or call: Your local emergency services (911 in the [place] A suicide crisis helpline, such as the [place] at 5060243109 or 988 in the [place]. This is open 24 hours a day. Summary Binge-drinking refers to drinking a large amount of alcohol in a short time. This is usually 5 drinks for men or 4 drinks for women, on one occasion. Binge-drinking can lead to serious health problems, such as heart disease, liver disease, or cancer. Binge-drinking raises your risk of developing alcohol dependence (alcohol use disorder) and social and relationship problems. Talk with your health care provider about your drinking habits. He or she may be able to recommend counseling if you drink too much. This information is not intended to replace advice given to you by your health care provider. Make sure you discuss any questions you have with your health care provider. Document Revised: 08/28/2020 Document Reviewed: 12/25/2019 Elsevier Patient Education  [place].   Hydroxyzine Capsules or Tablets What is this medication? HYDROXYZINE (hye [place] i zeen) treats the symptoms of allergies and allergic reactions. It may also be used to treat anxiety or cause drowsiness before a procedure. It works by blocking histamine, a substance released by the body during an allergic reaction. It belongs to a group of medications called antihistamines. This medicine may be used for other purposes; ask your health care provider or pharmacist if you have questions. COMMON BRAND NAME(S): ANX, Atarax, Rezine, Vistaril What should I tell my care team before I take this medication? They need to know if you have any of these conditions: Glaucoma Heart disease History of irregular heartbeat Kidney disease Liver disease Lung or breathing disease, like asthma Stomach or intestine problems Thyroid disease Trouble passing urine An unusual or allergic reaction to hydroxyzine, cetirizine, other medications, foods, dyes or preservatives Pregnant or trying to get pregnant Breast-feeding How should I use this medication? Take this medication by mouth with a full glass of water. Follow the directions on the prescription label. You may take this medication with food or on an empty stomach. Take your medication at regular intervals. Do not take your medication more often than directed. Talk to your care team regarding the use of this medication in children. Special care may be needed. While this medication may be prescribed for children as young as 15 years of age for selected conditions, precautions do apply. Patients over 29 years old may have a stronger reaction and need a smaller dose. Overdosage: If  you think you have taken too much of this medicine contact a poison control center or emergency room at once. NOTE: This medicine is only for you. Do not share this medicine with others. What if I miss a dose? If you miss a dose, take it as soon as you can. If it is almost time for  your next dose, take only that dose. Do not take double or extra doses. What may interact with this medication? Do not take this medication with any of the following: Cisapride Dronedarone Pimozide Thioridazine This medication may also interact with the following: Alcohol Antihistamines for allergy, cough, and cold Atropine Barbiturate medications for sleep or seizures, like phenobarbital Certain antibiotics like erythromycin or clarithromycin Certain medications for anxiety or sleep Certain medications for bladder problems like oxybutynin, tolterodine Certain medications for depression or psychotic disturbances Certain medications for irregular heart beat Certain medications for Parkinson's disease like benztropine, trihexyphenidyl Certain medications for seizures like phenobarbital, primidone Certain medications for stomach problems like dicyclomine, hyoscyamine Certain medications for travel sickness like scopolamine Ipratropium Narcotic medications for pain Other medications that prolong the QT interval (which can cause an abnormal heart rhythm) like dofetilide This list may not describe all possible interactions. Give your health care provider a list of all the medicines, herbs, non-prescription drugs, or dietary supplements you use. Also tell them if you smoke, drink alcohol, or use illegal drugs. Some items may interact with your medicine. What should I watch for while using this medication? Tell your care team if your symptoms do not improve. You may get drowsy or dizzy. Do not drive, use machinery, or do anything that needs mental alertness until you know how this medication affects you. Do not stand or sit up quickly, especially if you are an older patient. This reduces the risk of dizzy or fainting spells. Alcohol may interfere with the effect of this medication. Avoid alcoholic drinks. Your mouth may get dry. Chewing sugarless gum or sucking hard candy, and drinking plenty of  water may help. Contact your care team if the problem does not go away or is severe. This medication may cause dry eyes and blurred vision. If you wear contact lenses you may feel some discomfort. Lubricating drops may help. See your eye care specialist if the problem does not go away or is severe. If you are receiving skin tests for allergies, tell your care team you are using this medication. What side effects may I notice from receiving this medication? Side effects that you should report to your care team as soon as possible: Allergic reactions--skin rash, itching, hives, swelling of the face, lips, tongue, or throat Heart rhythm changes--fast or irregular heartbeat, dizziness, feeling faint or lightheaded, chest pain, trouble breathing Side effects that usually do not require medical attention (report to your care team if they continue or are bothersome): Confusion Drowsiness Dry mouth Hallucinations Headache This list may not describe all possible side effects. Call your doctor for medical advice about side effects. You may report side effects to FDA at 1-800-FDA-1088. Where should I keep my medication? Keep out of the reach of children and pets. Store at room temperature between 15 and 30 degrees C (59 and 86 degrees F). Keep container tightly closed. Throw away any unused medication after the expiration date. NOTE: This sheet is a summary. It may not cover all possible information. If you have questions about this medicine, talk to your doctor, pharmacist, or health care provider.  2022 Elsevier/Gold Standard (2020-04-17 00:00:00)  Bupropion Extended-Release Tablets (Depression/Mood Disorders) What is this medication? BUPROPION (byoo PROE pee on) treats depression. It increases norepinephrine and dopamine in the brain, hormones that help regulate mood. It belongs to a group of medications called NDRIs. This medicine may be used for other purposes; ask your health care provider or  pharmacist if you have questions. COMMON BRAND NAME(S): Aplenzin, Budeprion XL, Forfivo XL, Wellbutrin XL What should I tell my care team before I take this medication? They need to know if you have any of these conditions: An eating disorder, such as anorexia or bulimia Bipolar disorder or psychosis Diabetes or high blood sugar, treated with medication Glaucoma Head injury or brain tumor Heart disease, previous heart attack, or irregular heart beat High blood pressure Kidney or liver disease Seizures (convulsions) Suicidal thoughts or a previous suicide attempt Tourette's syndrome Weight loss An unusual or allergic reaction to bupropion, other medications, foods, dyes, or preservatives Pregnant or trying to become pregnant Breast-feeding How should I use this medication? Take this medication by mouth with a glass of water. Follow the directions on the prescription label. You can take it with or without food. If it upsets your stomach, take it with food. Do not crush, chew, or cut these tablets. This medication is taken once daily at the same time each day. Do not take your medication more often than directed. Do not stop taking this medication suddenly except upon the advice of your care team. Stopping this medication too quickly may cause serious side effects or your condition may worsen. A special MedGuide will be given to you by the pharmacist with each prescription and refill. Be sure to read this information carefully each time. Talk to your care team about the use of this medication in children. Special care may be needed. Overdosage: If you think you have taken too much of this medicine contact a poison control center or emergency room at once. NOTE: This medicine is only for you. Do not share this medicine with others. What if I miss a dose? If you miss a dose, skip the missed dose and take your next tablet at the regular time. Do not take double or extra doses. What may interact  with this medication? Do not take this medication with any of the following: Linezolid MAOIs like Azilect, Carbex, Eldepryl, Marplan, Nardil, and Parnate Methylene blue (injected into a vein) Other medications that contain bupropion like Zyban This medication may also interact with the following: Alcohol Certain medications for anxiety or sleep Certain medications for blood pressure like metoprolol, propranolol Certain medications for depression or psychotic disturbances Certain medications for HIV or AIDS like efavirenz, lopinavir, nelfinavir, ritonavir Certain medications for irregular heart beat like propafenone, flecainide Certain medications for Parkinson's disease like amantadine, levodopa Certain medications for seizures like carbamazepine, phenytoin, phenobarbital Cimetidine Clopidogrel Cyclophosphamide Digoxin Furazolidone Isoniazid Nicotine Orphenadrine Procarbazine Steroid medications like prednisone or cortisone Stimulant medications for attention disorders, weight loss, or to stay awake Tamoxifen Theophylline Thiotepa Ticlopidine Tramadol Warfarin This list may not describe all possible interactions. Give your health care provider a list of all the medicines, herbs, non-prescription drugs, or dietary supplements you use. Also tell them if you smoke, drink alcohol, or use illegal drugs. Some items may interact with your medicine. What should I watch for while using this medication? Tell your care team if your symptoms do not get better or if they get worse. Visit your care team for regular checks on your progress. Because it may take several weeks  to see the full effects of this medication, it is important to continue your treatment as prescribed. Watch for new or worsening thoughts of suicide or depression. This includes sudden changes in mood, behavior, or thoughts. These changes can happen at any time but are more common in the beginning of treatment or after a  change in dose. Call your care team right away if you experience these thoughts or worsening depression. Manic episodes may happen in patients with bipolar disorder who take this medication. Watch for changes in feelings or behaviors such as feeling anxious, nervous, agitated, panicky, irritable, hostile, aggressive, impulsive, severely restless, overly excited and hyperactive, or trouble sleeping. These symptoms can happen at anytime but are more common in the beginning of treatment or after a change in dose. Call your care team right away if you notice any of these symptoms. This medication may cause serious skin reactions. They can happen weeks to months after starting the medication. Contact your care team right away if you notice fevers or flu-like symptoms with a rash. The rash may be red or purple and then turn into blisters or peeling of the skin. Or, you might notice a red rash with swelling of the face, lips or lymph nodes in your neck or under your arms. Avoid drinks that contain alcohol while taking this medication. Drinking large amounts of alcohol, using sleeping or anxiety medications, or quickly stopping the use of these agents while taking this medication may increase your risk for a seizure. Do not drive or use heavy machinery until you know how this medication affects you. This medication can impair your ability to perform these tasks. Do not take this medication close to bedtime. It may prevent you from sleeping. Your mouth may get dry. Chewing sugarless gum or sucking hard candy, and drinking plenty of water may help. Contact your care team if the problem does not go away or is severe. The tablet shell for some brands of this medication does not dissolve. This is normal. The tablet shell may appear whole in the stool. This is not a cause for concern. What side effects may I notice from receiving this medication? Side effects that you should report to your care team as soon as  possible: Allergic reactions--skin rash, itching, hives, swelling of the face, lips, tongue, or throat Increase in blood pressure Mood and behavior changes--anxiety, nervousness, confusion, hallucinations, irritability, hostility, thoughts of suicide or self-harm, worsening mood, feelings of depression Redness, blistering, peeling, or loosening of the skin, including inside the mouth Seizures Sudden eye pain or change in vision such as blurry vision, seeing halos around lights, vision loss Side effects that usually do not require medical attention (report to your care team if they continue or are bothersome): Constipation Dizziness Dry mouth Loss of appetite Nausea Tremors or shaking Trouble sleeping This list may not describe all possible side effects. Call your doctor for medical advice about side effects. You may report side effects to FDA at 1-800-FDA-1088. Where should I keep my medication? Keep out of the reach of children and pets. Store at room temperature between 15 and 30 degrees C (59 and 86 degrees F). Throw away any unused medication after the expiration date. NOTE: This sheet is a summary. It may not cover all possible information. If you have questions about this medicine, talk to your doctor, pharmacist, or health care provider.  2022 Elsevier/Gold Standard (2020-04-17 00:00:00)

## 2021-02-20 NOTE — Progress Notes (Signed)
Chief Complaint  Patient presents with   Form Completion   Anxiety   Depression   F/u  1. Anxiety and depression phq 9 score 11 and gad 7 score 12 and drinking 4-5 drinks qhs and insomnia wakes up 2-4 am mad and angry resigned from job 02/14/21 but will go back [facility] fmla paperwork filled out  2. Htn not controlled on norvasc 5 mg and hyzaar 50-12.5 mg qd  Review of Systems  Constitutional:  Negative for weight loss.  HENT:  Negative for hearing loss.   Eyes:  Negative for blurred vision.  Respiratory:  Negative for shortness of breath.   Cardiovascular:  Negative for chest pain.  Gastrointestinal:  Negative for abdominal pain and blood in stool.  Musculoskeletal:  Negative for back pain.  Skin:  Negative for rash.  Neurological:  Negative for headaches.  Psychiatric/Behavioral:  Positive for depression. The patient is nervous/anxious and has insomnia.   Past Medical History:  Diagnosis Date   Chicken pox    Hypertension    [facility] spotted fever)    as kid   Past Surgical History:  Procedure Laterality Date   NO PAST SURGERIES     Family History  Adopted: Yes  Family history unknown: Yes   Social History   Socioeconomic History   Marital status: Married    Spouse name: Not on file   Number of children: Not on file   Years of education: Not on file   Highest education level: Not on file  Occupational History   Not on file  Tobacco Use   Smoking status: Former   Smokeless tobacco: Never  Vaping Use   Vaping Use: Never used  Substance and Sexual Activity   Alcohol use: Yes    Alcohol/week: 5.0 standard drinks    Types: 5 Standard drinks or equivalent per week   Drug use: Not Currently   Sexual activity: Yes    Partners: Male  Other Topics Concern   Not on file  Social History Narrative   Partner Tamala Fothergill    Works mailman    Former smoker quit in 2007 smoked x 10 years 1 ppd    Social Determinants of Radio broadcast assistant Strain:  Not on file  Food Insecurity: Not on file  Transportation Needs: Not on file  Physical Activity: Not on file  Stress: Not on file  Social Connections: Not on file  Intimate Partner Violence: Not on file   Current Meds  Medication Sig   buPROPion (WELLBUTRIN XL) 150 MG 24 hr tablet Take 1 tablet (150 mg total) by mouth daily.   hydrOXYzine (ATARAX) 25 MG tablet Take 1-2 tablets (25-50 mg total) by mouth at bedtime as needed.   losartan-hydrochlorothiazide (HYZAAR) 100-12.5 MG tablet Take 1 tablet by mouth daily. In am d/c 50-12.5 mg qd   Multiple Vitamins-Minerals (CENTRUM SILVER 50+MEN PO) Take by mouth daily at 12 noon.   [DISCONTINUED] amLODipine (NORVASC) 5 MG tablet Take 1 tablet (5 mg total) by mouth daily.   [DISCONTINUED] losartan-hydrochlorothiazide (HYZAAR) 50-12.5 MG tablet 1 pill daily in the am  for goal BP <130/<80   No Known Allergies No results found for this or any previous visit (from the past 2160 hour(s)). Objective  Body mass index is 28.07 kg/m. Wt Readings from Last 3 Encounters:  02/20/21 207 lb (93.9 kg)  06/24/20 210 lb (95.3 kg)  03/12/20 210 lb 3.2 oz (95.3 kg)   Temp Readings from Last 3 Encounters:  02/20/21 98 F (36.7 C) (Temporal)  03/12/20 98.6 F (37 C) (Oral)  02/21/20 98.7 F (37.1 C)   BP Readings from Last 3 Encounters:  02/20/21 (!) 160/84  03/12/20 138/88  02/26/20 (!) 142/88   Pulse Readings from Last 3 Encounters:  02/20/21 75  03/12/20 92  02/26/20 73    Physical Exam Vitals and nursing note reviewed.  Constitutional:      Appearance: Normal appearance. He is well-developed and well-groomed. He is obese.  HENT:     Head: Normocephalic and atraumatic.  Eyes:     Conjunctiva/sclera: Conjunctivae normal.     Pupils: Pupils are equal, round, and reactive to light.  Cardiovascular:     Rate and Rhythm: Normal rate and regular rhythm.     Heart sounds: Normal heart sounds.  Pulmonary:     Effort: Pulmonary effort is  normal. No respiratory distress.     Breath sounds: Normal breath sounds.  Abdominal:     Tenderness: There is no abdominal tenderness.  Musculoskeletal:     Lumbar back: Tenderness present. Negative right straight leg raise test and negative left straight leg raise test.  Skin:    General: Skin is warm and moist.  Neurological:     General: No focal deficit present.     Mental Status: He is alert and oriented to person, place, and time. Mental status is at baseline.     Sensory: Sensation is intact.     Motor: Motor function is intact.     Coordination: Coordination is intact.     Gait: Gait is intact. Gait normal.  Psychiatric:        Attention and Perception: Attention and perception normal.        Mood and Affect: Mood and affect normal.        Speech: Speech normal.        Behavior: Behavior normal. Behavior is cooperative.        Thought Content: Thought content normal.        Cognition and Memory: Cognition and memory normal.        Judgment: Judgment normal.    Assessment  Plan  Adjustment disorder with mixed anxiety and depressed mood - Plan: buPROPion (WELLBUTRIN XL) 150 MG 24 hr tablet, hydrOXYzine (ATARAX) 25-50 MG tablet  Insomnia, unspecified type - Plan: hydrOXYzine (ATARAX) 25 MG tablet  Alcohol abuse Disc reframe app  Primary hypertension - Plan: losartan-hydrochlorothiazide (HYZAAR) 100-12.5 MG tablet and norvasc 5 mg qd  Hordeolum externum of left upper eyelid - Plan: Ambulatory referral to Ophthalmology   HM Flu shot utd moderna 3/3 Tdap consider in the future  Hep C negative  Colonoscopy age 11 refer [facility]  psa consider age 52 baseline then routine 46 y.o rec healthy diet and exercise  Derm-consider in future    Hypertension, improved - Plan: Basic Metabolic Panel (BMET) Monitor BP improved  Increase norvasc to 5 mg 03/15/20 and my chart Friday hyzaar 50-12.5 my chart me and if still elevated will increase 5 mg pill and cont hyzaar as is by  03/22/20  Provider: Dr. Olivia Mackie McLean-Scocuzza-[facility]

## 2021-03-11 ENCOUNTER — Other Ambulatory Visit: Payer: Self-pay | Admitting: Adult Health

## 2021-03-11 DIAGNOSIS — R0981 Nasal congestion: Secondary | ICD-10-CM

## 2021-03-13 ENCOUNTER — Other Ambulatory Visit: Payer: Self-pay

## 2021-03-13 ENCOUNTER — Other Ambulatory Visit (INDEPENDENT_AMBULATORY_CARE_PROVIDER_SITE_OTHER): Payer: 59

## 2021-03-13 ENCOUNTER — Encounter: Payer: Self-pay | Admitting: Internal Medicine

## 2021-03-13 DIAGNOSIS — E538 Deficiency of other specified B group vitamins: Secondary | ICD-10-CM | POA: Diagnosis not present

## 2021-03-13 DIAGNOSIS — Z125 Encounter for screening for malignant neoplasm of prostate: Secondary | ICD-10-CM

## 2021-03-13 DIAGNOSIS — I10 Essential (primary) hypertension: Secondary | ICD-10-CM | POA: Diagnosis not present

## 2021-03-13 DIAGNOSIS — R5383 Other fatigue: Secondary | ICD-10-CM | POA: Diagnosis not present

## 2021-03-13 LAB — COMPREHENSIVE METABOLIC PANEL
ALT: 38 U/L (ref 0–53)
AST: 29 U/L (ref 0–37)
Albumin: 4.5 g/dL (ref 3.5–5.2)
Alkaline Phosphatase: 70 U/L (ref 39–117)
BUN: 21 mg/dL (ref 6–23)
CO2: 27 mEq/L (ref 19–32)
Calcium: 9.5 mg/dL (ref 8.4–10.5)
Chloride: 100 mEq/L (ref 96–112)
Creatinine, Ser: 1.01 mg/dL (ref 0.40–1.50)
GFR: 89.51 mL/min (ref >60.00)
Glucose, Bld: 88 mg/dL (ref 70–99)
Potassium: 4.1 mEq/L (ref 3.5–5.1)
Sodium: 137 mEq/L (ref 135–145)
Total Bilirubin: 0.9 mg/dL (ref 0.2–1.2)
Total Protein: 7.2 g/dL (ref 6.0–8.3)

## 2021-03-13 LAB — LIPID PANEL
Cholesterol: 230 mg/dL — ABNORMAL HIGH (ref 0–200)
HDL: 53.8 mg/dL (ref >39.00)
LDL Cholesterol: 152 mg/dL — ABNORMAL HIGH (ref 0–99)
NonHDL: 176.56
Total CHOL/HDL Ratio: 4
Triglycerides: 122 mg/dL (ref 0.0–149.0)
VLDL: 24.4 mg/dL (ref 0.0–40.0)

## 2021-03-13 LAB — TSH: TSH: 0.8 u[IU]/mL (ref 0.35–5.50)

## 2021-03-13 LAB — CBC WITH DIFFERENTIAL/PLATELET
Basophils Absolute: 0.1 10*3/uL (ref 0.0–0.1)
Basophils Relative: 1.1 % (ref 0.0–3.0)
Eosinophils Absolute: 0.1 10*3/uL (ref 0.0–0.7)
Eosinophils Relative: 2 % (ref 0.0–5.0)
HCT: 43.1 % (ref 39.0–52.0)
Hemoglobin: 14.6 g/dL (ref 13.0–17.0)
Lymphocytes Relative: 30 % (ref 12.0–46.0)
Lymphs Abs: 1.8 10*3/uL (ref 0.7–4.0)
MCHC: 33.8 g/dL (ref 30.0–36.0)
MCV: 91.3 fl (ref 78.0–100.0)
Monocytes Absolute: 0.5 10*3/uL (ref 0.1–1.0)
Monocytes Relative: 9.3 % (ref 3.0–12.0)
Neutro Abs: 3.4 10*3/uL (ref 1.4–7.7)
Neutrophils Relative %: 57.6 % (ref 43.0–77.0)
Platelets: 224 10*3/uL (ref 150.0–400.0)
RBC: 4.72 Mil/uL (ref 4.22–5.81)
RDW: 13.4 % (ref 11.5–15.5)
WBC: 5.9 10*3/uL (ref 4.0–10.5)

## 2021-03-13 LAB — PSA: PSA: 0.75 ng/mL (ref 0.10–4.00)

## 2021-03-13 LAB — VITAMIN B12: Vitamin B-12: 785 pg/mL (ref 211–911)

## 2021-03-14 ENCOUNTER — Other Ambulatory Visit: Payer: Self-pay | Admitting: Internal Medicine

## 2021-03-14 DIAGNOSIS — E785 Hyperlipidemia, unspecified: Secondary | ICD-10-CM

## 2021-03-14 LAB — URINALYSIS, ROUTINE W REFLEX MICROSCOPIC
Bacteria, UA: NONE SEEN /HPF
Bilirubin Urine: NEGATIVE
Glucose, UA: NEGATIVE
Hgb urine dipstick: NEGATIVE
Hyaline Cast: NONE SEEN /LPF
Ketones, ur: NEGATIVE
Leukocytes,Ua: NEGATIVE
Nitrite: NEGATIVE
RBC / HPF: NONE SEEN /HPF (ref 0–2)
Specific Gravity, Urine: 1.026 (ref 1.001–1.035)
Squamous Epithelial / HPF: NONE SEEN /HPF (ref <=5)
WBC, UA: NONE SEEN /HPF (ref 0–5)
pH: 5.5 (ref 5.0–8.0)

## 2021-03-14 LAB — MICROSCOPIC MESSAGE

## 2021-03-14 MED ORDER — ATORVASTATIN CALCIUM 10 MG PO TABS: 10.0000 mg | ORAL_TABLET | Freq: Every day | ORAL | 3 refills | Status: DC

## 2021-03-14 NOTE — Telephone Encounter (Signed)
-----   Message from Delorise Jackson, MD sent at 03/14/2021  7:40 AM EST ----- Cholesterol improved  Do you want to try statin medication to lower ? B12 normal Thyroid normal  Blood cts normal  Liver kidneys normal  Psa prostate cancer screening normal  Urine cloudy increase water 55-64 ounces daily

## 2021-03-26 ENCOUNTER — Encounter: Payer: Self-pay | Admitting: Internal Medicine

## 2021-04-03 ENCOUNTER — Ambulatory Visit: Payer: 59 | Admitting: Internal Medicine

## 2021-04-10 ENCOUNTER — Telehealth: Payer: Self-pay

## 2021-04-10 NOTE — Telephone Encounter (Signed)
Attempted to call pt to complete pre-visit planning. LM for pt to cb.

## 2021-04-11 ENCOUNTER — Ambulatory Visit: Payer: 59 | Admitting: Internal Medicine

## 2021-04-11 ENCOUNTER — Other Ambulatory Visit: Payer: Self-pay

## 2021-04-11 ENCOUNTER — Encounter: Payer: Self-pay | Admitting: Internal Medicine

## 2021-04-11 VITALS — BP 110/60 | HR 69 | Temp 98.0°F | Resp 18 | Ht 72.0 in | Wt 205.1 lb

## 2021-04-11 DIAGNOSIS — F4323 Adjustment disorder with mixed anxiety and depressed mood: Secondary | ICD-10-CM

## 2021-04-11 DIAGNOSIS — R6882 Decreased libido: Secondary | ICD-10-CM

## 2021-04-11 DIAGNOSIS — Z23 Encounter for immunization: Secondary | ICD-10-CM

## 2021-04-11 DIAGNOSIS — Z1211 Encounter for screening for malignant neoplasm of colon: Secondary | ICD-10-CM

## 2021-04-11 DIAGNOSIS — G4700 Insomnia, unspecified: Secondary | ICD-10-CM

## 2021-04-11 DIAGNOSIS — R7989 Other specified abnormal findings of blood chemistry: Secondary | ICD-10-CM

## 2021-04-11 DIAGNOSIS — Z1283 Encounter for screening for malignant neoplasm of skin: Secondary | ICD-10-CM

## 2021-04-11 MED ORDER — BUSPIRONE HCL 10 MG PO TABS: 10.0000 mg | ORAL_TABLET | Freq: Two times a day (BID) | ORAL | 2 refills | Status: DC

## 2021-04-11 MED ORDER — BUPROPION HCL ER (XL) 300 MG PO TB24: 300.0000 mg | ORAL_TABLET | Freq: Every day | ORAL | 3 refills | Status: DC

## 2021-04-11 NOTE — Patient Instructions (Addendum)
Phone Fax E-mail Address  [phone] Not available [place]   [place] [address]  Call for colonoscopy please  Dr. Armbruster/Dr. Roanna Raider    [facility]  [facility]  Phone Fax E-mail Address  [phone] Not available [address]   [place] [address]     Specialties     Dermatology       Buspirone Tablets What is this medication? BUSPIRONE (byoo SPYE rone) treats anxiety. It works by balancing the levels of dopamine and serotonin in your brain, hormones that help regulate mood. This medicine may be used for other purposes; ask your health care provider or pharmacist if you have questions. COMMON BRAND NAME(S): BuSpar, Buspar Dividose What should I tell my care team before I take this medication? They need to know if you have any of these conditions: Kidney or liver disease An unusual or allergic reaction to buspirone, other medications, foods, dyes, or preservatives Pregnant or trying to get pregnant Breast-feeding How should I use this medication? Take this medication by mouth with a glass of water. Follow the directions on the prescription label. You may take this medication with or without food. To ensure that this medication always works the same way for you, you should take it either always with or always without food. Take your doses at regular intervals. Do not take your medication more often than directed. Do not stop taking except on the advice of your care team. Talk to your care team about the use of this medication in children. Special care may be needed. Overdosage: If you think you have taken too much of this medicine contact a poison control center or emergency room at once. NOTE: This medicine is only for you. Do not share this medicine with others. What if I miss a dose? If you miss a dose, take it as soon as you can. If it is almost time for your next dose, take only that dose.  Do not take double or extra doses. What may interact with this medication? Do not take this medication with any of the following: Linezolid MAOIs like Carbex, Eldepryl, Marplan, Nardil, and Parnate Methylene blue Procarbazine This medication may also interact with the following: Diazepam Digoxin Diltiazem Erythromycin Grapefruit juice Haloperidol Medications for mental depression or mood problems Medications for seizures like carbamazepine, phenobarbital and phenytoin Nefazodone Other medications for anxiety Rifampin Ritonavir Some antifungal medications like itraconazole, ketoconazole, and voriconazole Verapamil Warfarin This list may not describe all possible interactions. Give your health care provider a list of all the medicines, herbs, non-prescription drugs, or dietary supplements you use. Also tell them if you smoke, drink alcohol, or use illegal drugs. Some items may interact with your medicine. What should I watch for while using this medication? Visit your care team for regular checks on your progress. It may take 1 to 2 weeks before your anxiety gets better. You may get drowsy or dizzy. Do not drive, use machinery, or do anything that needs mental alertness until you know how this medication affects you. Do not stand or sit up quickly, especially if you are an older patient. This reduces the risk of dizzy or fainting spells. Alcohol can make you more drowsy and dizzy. Avoid alcoholic drinks. What side effects may I notice from receiving this medication? Side effects that you should report to your care team as soon as possible: Allergic reactions--skin rash, itching, hives, swelling of the face, lips, tongue, or  throat Irritability, confusion, fast or irregular heartbeat, muscle stiffness, twitching muscles, sweating, high fever, seizure, chills, vomiting, diarrhea, which may be signs of serotonin syndrome Side effects that usually do not require medical attention (report to  your care team if they continue or are bothersome): Anxiety or nervousness Dizziness Drowsiness Headache Nausea Trouble sleeping This list may not describe all possible side effects. Call your doctor for medical advice about side effects. You may report side effects to FDA at 1-800-FDA-1088. Where should I keep my medication? Keep out of the reach of children. Store at room temperature below 30 degrees C (86 degrees F). Protect from light. Keep container tightly closed. Throw away any unused medication after the expiration date. NOTE: This sheet is a summary. It may not cover all possible information. If you have questions about this medicine, talk to your doctor, pharmacist, or health care provider.  2022 Elsevier/Gold Standard (2020-05-02 00:00:00)   Managing Anxiety, Adult After being diagnosed with anxiety, you may be relieved to know why you have felt or behaved a certain way. You may also feel overwhelmed about the treatment ahead and what it will mean for your life. With care and support, you can manage this condition. How to manage lifestyle changes Managing stress and anxiety Stress is your body's reaction to life changes and events, both good and bad. Most stress will last just a few hours, but stress can be ongoing and can lead to more than just stress. Although stress can play a major role in anxiety, it is not the same as anxiety. Stress is usually caused by something external, such as a deadline, test, or competition. Stress normally passes after the triggering event has ended.  Anxiety is caused by something internal, such as imagining a terrible outcome or worrying that something will go wrong that will devastate you. Anxiety often does not go away even after the triggering event is over, and it can become long-term (chronic) worry. It is important to understand the differences between stress and anxiety and to manage your stress effectively so that it does not lead to an anxious  response. Talk with your health care provider or a counselor to learn more about reducing anxiety and stress. He or she may suggest tension reduction techniques, such as: Music therapy. Spend time creating or listening to music that you enjoy and that inspires you. Mindfulness-based meditation. Practice being aware of your normal breaths while not trying to control your breathing. It can be done while sitting or walking. Centering prayer. This involves focusing on a word, phrase, or sacred image that means something to you and brings you peace. Deep breathing. To do this, expand your stomach and inhale slowly through your nose. Hold your breath for 3-5 seconds. Then exhale slowly, letting your stomach muscles relax. Self-talk. Learn to notice and identify thought patterns that lead to anxiety reactions and change those patterns to thoughts that feel peaceful. Muscle relaxation. Taking time to tense muscles and then relax them. Choose a tension reduction technique that fits your lifestyle and personality. These techniques take time and practice. Set aside 5-15 minutes a day to do them. Therapists can offer counseling and training in these techniques. The training to help with anxiety may be covered by some insurance plans. Other things you can do to manage stress and anxiety include: Keeping a stress diary. This can help you learn what triggers your reaction and then learn ways to manage your response. Thinking about how you react to certain situations.  You may not be able to control everything, but you can control your response. Making time for activities that help you relax and not feeling guilty about spending your time in this way. Doing visual imagery. This involves imagining or creating mental pictures to help you relax. Practicing yoga. Through yoga poses, you can lower tension and promote relaxation.  Medicines Medicines can help ease symptoms. Medicines for anxiety include: Antidepressant  medicines. These are usually prescribed for long-term daily control. Anti-anxiety medicines. These may be added in severe cases, especially when panic attacks occur. Medicines will be prescribed by a health care provider. When used together, medicines, psychotherapy, and tension reduction techniques may be the most effective treatment. Relationships Relationships can play a big part in helping you recover. Try to spend more time connecting with trusted friends and family members. Consider going to couples counseling if you have a partner, taking family education classes, or going to family therapy. Therapy can help you and others better understand your condition. How to recognize changes in your anxiety Everyone responds differently to treatment for anxiety. Recovery from anxiety happens when symptoms decrease and stop interfering with your daily activities at home or work. This may mean that you will start to: Have better concentration and focus. Worry will interfere less in your daily thinking. Sleep better. Be less irritable. Have more energy. Have improved memory. It is also important to recognize when your condition is getting worse. Contact your health care provider if your symptoms interfere with home or work and you feel like your condition is not improving. Follow these instructions at home: Activity Exercise. Adults should do the following: Exercise for at least 150 minutes each week. The exercise should increase your heart rate and make you sweat (moderate-intensity exercise). Strengthening exercises at least twice a week. Get the right amount and quality of sleep. Most adults need 7-9 hours of sleep each night. Lifestyle  Eat a healthy diet that includes plenty of vegetables, fruits, whole grains, low-fat dairy products, and lean protein. Do not eat a lot of foods that are high in fats, added sugars, or salt (sodium). Make choices that simplify your life. Do not use any products  that contain nicotine or tobacco. These products include cigarettes, chewing tobacco, and vaping devices, such as e-cigarettes. If you need help quitting, ask your health care provider. Avoid caffeine, alcohol, and certain over-the-counter cold medicines. These may make you feel worse. Ask your pharmacist which medicines to avoid. General instructions Take over-the-counter and prescription medicines only as told by your health care provider. Keep all follow-up visits. This is important. Where to find support You can get help and support from these sources: Self-help groups. Online and [employer]. A trusted spiritual leader. Couples counseling. Family education classes. Family therapy. Where to find more information You may find that joining a support group helps you deal with your anxiety. The following sources can help you locate counselors or support groups near you: [place]: www.mentalhealthamerica.net Anxiety and Depression Association of [place] (ADAA): https://www.clark.net/ National Alliance on Mental Illness (NAMI): www.nami.org Contact a health care provider if: You have a hard time staying focused or finishing daily tasks. You spend many hours a day feeling worried about everyday life. You become exhausted by worry. You start to have headaches or frequently feel tense. You develop chronic nausea or diarrhea. Get help right away if: You have a racing heart and shortness of breath. You have thoughts of hurting yourself or others. If you ever feel  like you may hurt yourself or others, or have thoughts about taking your own life, get help right away. Go to your nearest emergency department or: Call your local emergency services (911 in the [place] Call a suicide crisis helpline, such as the [place] at 260-332-6527 or 988 in the [place]. This is open 24 hours a day in the [place] Text the Crisis Text Line at 220-327-4164 (in the  [place].). Summary Taking steps to learn and use tension reduction techniques can help calm you and help prevent triggering an anxiety reaction. When used together, medicines, psychotherapy, and tension reduction techniques may be the most effective treatment. Family, friends, and partners can play a big part in supporting you. This information is not intended to replace advice given to you by your health care provider. Make sure you discuss any questions you have with your health care provider. Document Revised: 08/28/2020 Document Reviewed: 05/26/2020 Elsevier Patient Education  [place].  Generalized Anxiety Disorder, Adult Generalized anxiety disorder (GAD) is a mental health condition. Unlike normal worries, anxiety related to GAD is not triggered by a specific event. These worries do not fade or get better with time. GAD interferes with relationships, work, and school. GAD symptoms can vary from mild to severe. People with severe GAD can have intense waves of anxiety with physical symptoms that are similar to panic attacks. What are the causes? The exact cause of GAD is not known, but the following are believed to have an impact: Differences in natural brain chemicals. Genes passed down from parents to children. Differences in the way threats are perceived. Development and stress during childhood. Personality. What increases the risk? The following factors may make you more likely to develop this condition: Being male. Having a family history of anxiety disorders. Being very shy. Experiencing very stressful life events, such as the death of a loved one. Having a very stressful family environment. What are the signs or symptoms? People with GAD often worry excessively about many things in their lives, such as their health and family. Symptoms may also include: Mental and emotional symptoms: Worrying excessively about natural disasters. Fear of being late. Difficulty  concentrating. Fears that others are judging your performance. Physical symptoms: Fatigue. Headaches, muscle tension, muscle twitches, trembling, or feeling shaky. Feeling like your heart is pounding or beating very fast. Feeling out of breath or like you cannot take a deep breath. Having trouble falling asleep or staying asleep, or experiencing restlessness. Sweating. Nausea, diarrhea, or irritable bowel syndrome (IBS). Behavioral symptoms: Experiencing erratic moods or irritability. Avoidance of new situations. Avoidance of people. Extreme difficulty making decisions. How is this diagnosed? This condition is diagnosed based on your symptoms and medical history. You will also have a physical exam. Your health care provider may perform tests to rule out other possible causes of your symptoms. To be diagnosed with GAD, a person must have anxiety that: Is out of his or her control. Affects several different aspects of his or her life, such as work and relationships. Causes distress that makes him or her unable to take part in normal activities. Includes at least three symptoms of GAD, such as restlessness, fatigue, trouble concentrating, irritability, muscle tension, or sleep problems. Before your health care provider can confirm a diagnosis of GAD, these symptoms must be present more days than they are not, and they must last for 6 months or longer. How is this treated? This condition may be treated with: Medicine. Antidepressant medicine is usually  prescribed for long-term daily control. Anti-anxiety medicines may be added in severe cases, especially when panic attacks occur. Talk therapy (psychotherapy). Certain types of talk therapy can be helpful in treating GAD by providing support, education, and guidance. Options include: Cognitive behavioral therapy (CBT). People learn coping skills and self-calming techniques to ease their physical symptoms. They learn to identify unrealistic  thoughts and behaviors and to replace them with more appropriate thoughts and behaviors. Acceptance and commitment therapy (ACT). This treatment teaches people how to be mindful as a way to cope with unwanted thoughts and feelings. Biofeedback. This process trains you to manage your body's response (physiological response) through breathing techniques and relaxation methods. You will work with a therapist while machines are used to monitor your physical symptoms. Stress management techniques. These include yoga, meditation, and exercise. A mental health specialist can help determine which treatment is best for you. Some people see improvement with one type of therapy. However, other people require a combination of therapies. Follow these instructions at home: Lifestyle Maintain a consistent routine and schedule. Anticipate stressful situations. Create a plan and allow extra time to work with your plan. Practice stress management or self-calming techniques that you have learned from your therapist or your health care provider. Exercise regularly and spend time outdoors. Eat a healthy diet that includes plenty of vegetables, fruits, whole grains, low-fat dairy products, and lean protein. Do not eat a lot of foods that are high in fat, added sugar, or salt (sodium). Drink plenty of water. Avoid alcohol. Alcohol can increase anxiety. Avoid caffeine and certain over-the-counter cold medicines. These may make you feel worse. Ask your pharmacist which medicines to avoid. General instructions Take over-the-counter and prescription medicines only as told by your health care provider. Understand that you are likely to have setbacks. Accept this and be kind to yourself as you persist to take better care of yourself. Anticipate stressful situations. Create a plan and allow extra time to work with your plan. Recognize and accept your accomplishments, even if you judge them as small. Spend time with people  who care about you. Keep all follow-up visits. This is important. Where to find more information [place]: https://carter.com/ Substance Abuse and Mental Health Services: ktimeonline.com Contact a health care provider if: Your symptoms do not get better. Your symptoms get worse. You have signs of depression, such as: A persistently sad or irritable mood. Loss of enjoyment in activities that used to bring you joy. Change in weight or eating. Changes in sleeping habits. Get help right away if: You have thoughts about hurting yourself or others. If you ever feel like you may hurt yourself or others, or have thoughts about taking your own life, get help right away. Go to your nearest emergency department or: Call your local emergency services (911 in the [place] Call a suicide crisis helpline, such as the [facility] at 814-519-8672 or 988 in the [place]. This is open 24 hours a day in the [place] Text the Crisis Text Line at 340-637-4906 (in the [place].). Summary Generalized anxiety disorder (GAD) is a mental health condition that involves worry that is not triggered by a specific event. People with GAD often worry excessively about many things in their lives, such as their health and family. GAD may cause symptoms such as restlessness, trouble concentrating, sleep problems, frequent sweating, nausea, diarrhea, headaches, and trembling or muscle twitching. A mental health specialist can help determine which treatment is best for you. Some  people see improvement with one type of therapy. However, other people require a combination of therapies. This information is not intended to replace advice given to you by your health care provider. Make sure you discuss any questions you have with your health care provider. Document Revised: 08/28/2020 Document Reviewed: 05/26/2020 Elsevier Patient Education  [place].

## 2021-04-11 NOTE — Progress Notes (Signed)
Chief Complaint  Patient presents with   Follow-up    6wk f/u - adjustment disorder   Medication Problem    Meds not working Wellbutrin for anxiety and the Hydrox for sleep.   F/u  1. 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 Reduced libido 3. Htn controlled norvasc 5 mg and hyzaar 100-12.5 mg qd    Review of Systems  Constitutional:  Negative for weight loss.  HENT:  Negative for hearing loss.   Eyes:  Negative for blurred vision.  Respiratory:  Negative for shortness of breath.   Cardiovascular:  Negative for chest pain.  Gastrointestinal:  Negative for abdominal pain and blood in stool.  Musculoskeletal:  Negative for back pain.  Skin:  Negative for rash.  Neurological:  Negative for headaches.  Psychiatric/Behavioral:  Negative for depression.   Past Medical History:  Diagnosis Date   Chicken pox    Hypertension    RMSF [facility] spotted fever)    as kid   Past Surgical History:  Procedure Laterality Date   NO PAST SURGERIES     Family History  Adopted: Yes  Family history unknown: Yes   Social History   Socioeconomic History   Marital status: Married    Spouse name: Not on file   Number of children: Not on file   Years of education: Not on file   Highest education level: Not on file  Occupational History   Not on file  Tobacco Use   Smoking status: Former   Smokeless tobacco: Never  Vaping Use   Vaping Use: Never used  Substance and Sexual Activity   Alcohol use: Yes    Alcohol/week: 5.0 standard drinks    Types: 5 Standard drinks or equivalent per week   Drug use: Not Currently   Sexual activity: Yes    Partners: Male  Other Topics Concern   Not on file  Social History Narrative    Partner Tamala Fothergill    Works mailman    Former smoker quit in 2007 smoked x 10 years 1 ppd    Social Determinants of Radio broadcast assistant Strain: Not on file  Food Insecurity: Not on file  Transportation Needs: Not on file  Physical Activity: Not on file  Stress: Not on file  Social Connections: Not on file  Intimate Partner Violence: Not on file   Current Meds  Medication Sig   amLODipine (NORVASC) 5 MG tablet Take 1 tablet (5 mg total) by mouth daily.   atorvastatin (LIPITOR) 10 MG tablet Take 1 tablet (10 mg total) by mouth daily. After 6 pm   busPIRone (BUSPAR) 10 MG tablet Take 1 tablet (10 mg total) by mouth 2 (two) times daily.   losartan-hydrochlorothiazide (HYZAAR) 100-12.5 MG tablet Take 1 tablet by mouth daily. In am d/c 50-12.5 mg qd   Multiple Vitamins-Minerals (CENTRUM SILVER 50+MEN PO) Take by mouth daily at 12 noon.   No Known Allergies Recent Results (from the past 2160 hour(s))  PSA     Status: None   Collection Time: 03/13/21  9:35 AM  Result Value Ref Range   PSA 0.75 0.10 - 4.00 ng/mL    Comment: Test performed using Access [employer] PSA  Assay, a parmagnetic partical, chemiluminecent immunoassay.  Vitamin B12     Status: None   Collection Time: 03/13/21  9:35 AM  Result Value Ref Range   Vitamin B-12 785 211 - 911 pg/mL  Urinalysis, Routine w reflex microscopic     Status: Abnormal   Collection Time: 03/13/21  9:35 AM  Result Value Ref Range   Color, Urine YELLOW YELLOW   APPearance TURBID (A) CLEAR   Specific Gravity, Urine 1.026 1.001 - 1.035   pH 5.5 5.0 - 8.0   Glucose, UA NEGATIVE NEGATIVE   Bilirubin Urine NEGATIVE NEGATIVE   Ketones, ur NEGATIVE NEGATIVE   Hgb urine dipstick NEGATIVE NEGATIVE   Protein, ur TRACE (A) NEGATIVE   Nitrite NEGATIVE NEGATIVE   Leukocytes,Ua NEGATIVE NEGATIVE   WBC, UA NONE SEEN 0 - 5 /HPF   RBC / HPF NONE SEEN 0 - 2 /HPF   Squamous Epithelial / LPF NONE SEEN < OR = 5 /HPF   Bacteria, UA NONE SEEN NONE  SEEN /HPF   Hyaline Cast NONE SEEN NONE SEEN /LPF  TSH     Status: None   Collection Time: 03/13/21  9:35 AM  Result Value Ref Range   TSH 0.80 0.35 - 5.50 uIU/mL  CBC with Differential/Platelet     Status: None   Collection Time: 03/13/21  9:35 AM  Result Value Ref Range   WBC 5.9 4.0 - 10.5 K/uL   RBC 4.72 4.22 - 5.81 Mil/uL   Hemoglobin 14.6 13.0 - 17.0 g/dL   HCT 43.1 39.0 - 52.0 %   MCV 91.3 78.0 - 100.0 fl   MCHC 33.8 30.0 - 36.0 g/dL   RDW 13.4 11.5 - 15.5 %   Platelets 224.0 150.0 - 400.0 K/uL   Neutrophils Relative % 57.6 43.0 - 77.0 %   Lymphocytes Relative 30.0 12.0 - 46.0 %   Monocytes Relative 9.3 3.0 - 12.0 %   Eosinophils Relative 2.0 0.0 - 5.0 %   Basophils Relative 1.1 0.0 - 3.0 %   Neutro Abs 3.4 1.4 - 7.7 K/uL   Lymphs Abs 1.8 0.7 - 4.0 K/uL   Monocytes Absolute 0.5 0.1 - 1.0 K/uL   Eosinophils Absolute 0.1 0.0 - 0.7 K/uL   Basophils Absolute 0.1 0.0 - 0.1 K/uL  Lipid panel     Status: Abnormal   Collection Time: 03/13/21  9:35 AM  Result Value Ref Range   Cholesterol 230 (H) 0 - 200 mg/dL    Comment: ATP III Classification       Desirable:  < 200 mg/dL               Borderline High:  200 - 239 mg/dL          High:  > = 240 mg/dL   Triglycerides 122.0 0.0 - 149.0 mg/dL    Comment: Normal:  <150 mg/dLBorderline High:  150 - 199 mg/dL   HDL 53.80 >39.00 mg/dL   VLDL 24.4 0.0 - 40.0 mg/dL   LDL Cholesterol 152 (H) 0 - 99 mg/dL   Total CHOL/HDL Ratio 4     Comment:                Men          Women1/2 Average Risk     3.4          3.3Average Risk          5.0          4.42X Average Risk  9.6          7.13X Average Risk          15.0          11.0                       NonHDL 176.56     Comment: NOTE:  Non-HDL goal should be 30 mg/dL higher than patient's LDL goal (i.e. LDL goal of < 70 mg/dL, would have non-HDL goal of < 100 mg/dL)  Comprehensive metabolic panel     Status: None   Collection Time: 03/13/21  9:35 AM  Result Value Ref Range   Sodium  137 135 - 145 mEq/L   Potassium 4.1 3.5 - 5.1 mEq/L   Chloride 100 96 - 112 mEq/L   CO2 27 19 - 32 mEq/L   Glucose, Bld 88 70 - 99 mg/dL   BUN 21 6 - 23 mg/dL   Creatinine, Ser 1.01 0.40 - 1.50 mg/dL   Total Bilirubin 0.9 0.2 - 1.2 mg/dL   Alkaline Phosphatase 70 39 - 117 U/L   AST 29 0 - 37 U/L   ALT 38 0 - 53 U/L   Total Protein 7.2 6.0 - 8.3 g/dL   Albumin 4.5 3.5 - 5.2 g/dL   GFR 89.51 >60.00 mL/min    Comment: Calculated using the CKD-EPI Creatinine Equation (2021)   Calcium 9.5 8.4 - 10.5 mg/dL  MICROSCOPIC MESSAGE     Status: None   Collection Time: 03/13/21  9:35 AM  Result Value Ref Range   Note      Comment: This urine was analyzed for the presence of WBC,  RBC, bacteria, casts, and other formed elements.  Only those elements seen were reported. . .    Objective  Body mass index is 27.82 kg/m. Wt Readings from Last 3 Encounters:  04/11/21 205 lb 2 oz (93 kg)  02/20/21 207 lb (93.9 kg)  06/24/20 210 lb (95.3 kg)   Temp Readings from Last 3 Encounters:  04/11/21 98 F (36.7 C) (Oral)  02/20/21 98 F (36.7 C) (Temporal)  03/12/20 98.6 F (37 C) (Oral)   BP Readings from Last 3 Encounters:  04/11/21 110/60  02/20/21 (!) 160/84  03/12/20 138/88   Pulse Readings from Last 3 Encounters:  04/11/21 69  02/20/21 75  03/12/20 92    Physical Exam Vitals and nursing note reviewed.  Constitutional:      Appearance: Normal appearance. He is well-developed and well-groomed.  HENT:     Head: Normocephalic and atraumatic.  Eyes:     Conjunctiva/sclera: Conjunctivae normal.     Pupils: Pupils are equal, round, and reactive to light.  Cardiovascular:     Rate and Rhythm: Normal rate and regular rhythm.     Heart sounds: Normal heart sounds.  Pulmonary:     Effort: Pulmonary effort is normal. No respiratory distress.     Breath sounds: Normal breath sounds.  Abdominal:     Tenderness: There is no abdominal tenderness.  Skin:    General: Skin is warm and  moist.  Neurological:     General: No focal deficit present.     Mental Status: He is alert and oriented to person, place, and time. Mental status is at baseline.     Sensory: Sensation is intact.     Motor: Motor function is intact.     Coordination: Coordination is intact.     Gait: Gait is intact. Gait normal.  Psychiatric:        Attention and Perception: Attention and perception normal.        Mood and Affect: Mood and affect normal.        Speech: Speech normal.        Behavior: Behavior normal. Behavior is cooperative.        Thought Content: Thought content normal.        Cognition and Memory: Cognition and memory normal.        Judgment: Judgment normal.    Assessment  Plan  Adjustment disorder with mixed anxiety and depressed mood - Plan: busPIRone (BUSPAR) 10 MG tablet bid atarax did not work, buPROPion (WELLBUTRIN XL) 300 MG 24 hr tablet from 150  Consider celexa low dose in the future  Low libido - Plan: Testosterone  Insomnia, unspecified type improved after stopping etoh   HM Tdap today  Encounter for screening colonoscopy - Plan: Ambulatory referral to Gastroenterology Referred   Skin cancer screening - Plan: Ambulatory referral to Dermatology   Referred   Flu shot utd moderna 3/3 Tdap given today Hep C negative  Colonoscopy age 27 refer [facility]  psa consider age 50 baseline then routine 46 y.o rec healthy diet and exercise  Derm-consider in future    Eye md f/u 2/27 or 04/15/21   Provider: Dr. Olivia Mackie McLean-Scocuzza-Internal Medicine

## 2021-05-15 ENCOUNTER — Encounter: Payer: Self-pay | Admitting: Internal Medicine

## 2021-05-15 ENCOUNTER — Other Ambulatory Visit (INDEPENDENT_AMBULATORY_CARE_PROVIDER_SITE_OTHER): Payer: 59

## 2021-05-15 DIAGNOSIS — R6882 Decreased libido: Secondary | ICD-10-CM

## 2021-05-15 DIAGNOSIS — R7989 Other specified abnormal findings of blood chemistry: Secondary | ICD-10-CM | POA: Insufficient documentation

## 2021-05-15 LAB — TESTOSTERONE: Testosterone: 266.78 ng/dL — ABNORMAL LOW (ref 300.00–890.00)

## 2021-05-15 NOTE — Telephone Encounter (Signed)
-----   Message from Delorise Jackson, MD sent at 05/15/2021 10:49 AM EDT ----- ?Testosterone is low  ?Do you want a referral to urology to further treat this condition?  ? ?

## 2021-05-16 ENCOUNTER — Encounter: Payer: Self-pay | Admitting: Internal Medicine

## 2021-05-16 NOTE — Telephone Encounter (Signed)
Okay for letter to extend Patient's FMLA for 3-4 more weeks?  ?

## 2021-05-19 DIAGNOSIS — U071 COVID-19: Secondary | ICD-10-CM | POA: Insufficient documentation

## 2021-05-19 NOTE — Telephone Encounter (Signed)
Pt calling in stating that his FMLA paper work have been denied two do to clerical error... Pt is requesting to speak with you about the information that he received... Pt stated that the denied FMLA is impacting him from moving to another position at work.... Pt requesting callback.Marland Kitchen  ?

## 2021-05-20 ENCOUNTER — Ambulatory Visit (AMBULATORY_SURGERY_CENTER): Payer: 59 | Admitting: *Deleted

## 2021-05-20 VITALS — Ht 72.0 in | Wt 200.0 lb

## 2021-05-20 DIAGNOSIS — Z1211 Encounter for screening for malignant neoplasm of colon: Secondary | ICD-10-CM

## 2021-05-20 MED ORDER — NA SULFATE-K SULFATE-MG SULF 17.5-3.13-1.6 GM/177ML PO SOLN: 1.0000 | Freq: Once | ORAL | 0 refills | Status: AC

## 2021-05-20 NOTE — Telephone Encounter (Signed)
Left Message to call office. ?

## 2021-05-20 NOTE — Telephone Encounter (Signed)
Spoke with patient an dhe is call HR to find out the reason FMLA as denied what kind of clerical error was made or if clerical error was made.  ?

## 2021-05-20 NOTE — Progress Notes (Signed)
No egg or soy allergy known to patient  ?No issues known to pt with past sedation with any surgeries or procedures ?Patient denies ever being told they had issues or difficulty with intubation  ?No FH of Malignant Hyperthermia- adopted so unaware of Family hx  ?Pt is not on diet pills ?Pt is not on  home 02  ?Pt is not on blood thinners  ?Pt denies issues with constipation  ?No A fib or A flutter ? ?NO PA's for preps discussed with pt In PV today  ?Discussed with pt there will be an out-of-pocket cost for prep and that varies from $0 to 70 +  dollars - pt verbalized understanding  ? ?Due to the COVID-19 pandemic we are asking patients to follow certain guidelines in PV and the [place]   ?Pt aware of COVID protocols and [facility] guidelines  ? ?PV completed over the phone. Pt verified name, DOB, address and insurance during PV today.  ? ?Pt encouraged to call with questions or issues.  ?If pt has My chart, procedure instructions sent via My Chart  ? ?

## 2021-05-21 ENCOUNTER — Encounter: Payer: Self-pay | Admitting: Gastroenterology

## 2021-06-02 ENCOUNTER — Encounter: Payer: Self-pay | Admitting: Gastroenterology

## 2021-06-02 ENCOUNTER — Ambulatory Visit (AMBULATORY_SURGERY_CENTER): Payer: 59 | Admitting: Gastroenterology

## 2021-06-02 VITALS — BP 120/69 | HR 54 | Temp 98.9°F | Resp 14 | Ht 72.0 in | Wt 200.0 lb

## 2021-06-02 DIAGNOSIS — D122 Benign neoplasm of ascending colon: Secondary | ICD-10-CM

## 2021-06-02 DIAGNOSIS — Z1211 Encounter for screening for malignant neoplasm of colon: Secondary | ICD-10-CM | POA: Diagnosis not present

## 2021-06-02 DIAGNOSIS — D123 Benign neoplasm of transverse colon: Secondary | ICD-10-CM | POA: Diagnosis not present

## 2021-06-02 DIAGNOSIS — D125 Benign neoplasm of sigmoid colon: Secondary | ICD-10-CM

## 2021-06-02 MED ORDER — SODIUM CHLORIDE 0.9 % IV SOLN: 500.0000 mL | Freq: Once | INTRAVENOUS | Status: DC

## 2021-06-02 NOTE — Progress Notes (Signed)
? ?History & Physical ? ?Primary Care Physician:  McLean-Scocuzza, Nino Glow, MD ?Primary Gastroenterologist: Lucio Edward, MD ? ?CHIEF COMPLAINT:  CRC screening ? ?HPI: Nathan Fitzgerald is a 46 y.o. male for CRC screening, average risk, with colonoscopy. ? ? ?Past Medical History:  ?Diagnosis Date  ? Anxiety   ? Chicken pox   ? Heart murmur   ? slight  ? Hyperlipidemia   ? Hypertension   ? RMSF [facility] spotted fever)   ? as kid- also had several tic borne infections over the years  ? ? ?Past Surgical History:  ?Procedure Laterality Date  ? TYMPANOSTOMY TUBE PLACEMENT    ? as kid  ? ? ?Prior to Admission medications   ?Medication Sig Start Date End Date Taking? Authorizing Provider  ?amLODipine (NORVASC) 5 MG tablet Take 1 tablet (5 mg total) by mouth daily. 02/20/21  Yes McLean-Scocuzza, Nino Glow, MD  ?atorvastatin (LIPITOR) 10 MG tablet Take 1 tablet (10 mg total) by mouth daily. After 6 pm 03/14/21  Yes McLean-Scocuzza, Nino Glow, MD  ?buPROPion (WELLBUTRIN XL) 300 MG 24 hr tablet Take 1 tablet (300 mg total) by mouth daily. 04/11/21  Yes McLean-Scocuzza, Nino Glow, MD  ?busPIRone (BUSPAR) 10 MG tablet Take 1 tablet (10 mg total) by mouth 2 (two) times daily. 04/11/21  Yes McLean-Scocuzza, Nino Glow, MD  ?losartan-hydrochlorothiazide (HYZAAR) 100-12.5 MG tablet Take 1 tablet by mouth daily. In am d/c 50-12.5 mg qd 02/20/21  Yes McLean-Scocuzza, Nino Glow, MD  ?Multiple Vitamins-Minerals (CENTRUM SILVER 50+MEN PO) Take by mouth daily at 12 noon.   Yes [provider]  ?fluticasone (FLONASE) 50 MCG/ACT nasal spray Place 2 sprays into both nostrils daily. ?Patient not taking: Reported on 02/20/2021 06/24/20   Doreen Beam, FNP  ? ? ?Current Outpatient Medications  ?Medication Sig Dispense Refill  ? amLODipine (NORVASC) 5 MG tablet Take 1 tablet (5 mg total) by mouth daily. 90 tablet 3  ? atorvastatin (LIPITOR) 10 MG tablet Take 1 tablet (10 mg total) by mouth daily. After 6 pm 90 tablet 3  ? buPROPion (WELLBUTRIN  XL) 300 MG 24 hr tablet Take 1 tablet (300 mg total) by mouth daily. 90 tablet 3  ? busPIRone (BUSPAR) 10 MG tablet Take 1 tablet (10 mg total) by mouth 2 (two) times daily. 60 tablet 2  ? losartan-hydrochlorothiazide (HYZAAR) 100-12.5 MG tablet Take 1 tablet by mouth daily. In am d/c 50-12.5 mg qd 90 tablet 3  ? Multiple Vitamins-Minerals (CENTRUM SILVER 50+MEN PO) Take by mouth daily at 12 noon.    ? fluticasone (FLONASE) 50 MCG/ACT nasal spray Place 2 sprays into both nostrils daily. (Patient not taking: Reported on 02/20/2021) 15.8 mL 2  ? ?Current Facility-Administered Medications  ?Medication Dose Route Frequency Provider Last Rate Last Admin  ? 0.9 %  sodium chloride infusion  500 mL Intravenous Once Ladene Artist, MD      ? ? ?Allergies as of 06/02/2021  ? (No Known Allergies)  ? ? ?Family History  ?Adopted: Yes  ?Family history unknown: Yes  ? ? ?Social History  ? ?Socioeconomic History  ? Marital status: Married  ?  Spouse name: Not on file  ? Number of children: Not on file  ? Years of education: Not on file  ? Highest education level: Not on file  ?Occupational History  ? Not on file  ?Tobacco Use  ? Smoking status: Former  ? Smokeless tobacco: Never  ?Vaping Use  ? Vaping Use: Never used  ?  Substance and Sexual Activity  ? Alcohol use: Yes  ?  Alcohol/week: 5.0 standard drinks  ?  Types: 5 Standard drinks or equivalent per week  ?  Comment: social  ? Drug use: Not Currently  ? Sexual activity: Yes  ?  Partners: Male  ?Other Topics Concern  ? Not on file  ?Social History Narrative  ? Partner Tamala Fothergill   ? Works Freight forwarder   ? Former smoker quit in 2007 smoked x 10 years 1 ppd   ? ?Social Determinants of Health  ? ?Financial Resource Strain: Not on file  ?Food Insecurity: Not on file  ?Transportation Needs: Not on file  ?Physical Activity: Not on file  ?Stress: Not on file  ?Social Connections: Not on file  ?Intimate Partner Violence: Not on file  ? ? ?Review of Systems: ? ?All systems reviewed an  negative except where noted in HPI. ? ?Gen: Denies any fever, chills, sweats, anorexia, fatigue, weakness, malaise, weight loss, and sleep disorder ?CV: Denies chest pain, angina, palpitations, syncope, orthopnea, PND, peripheral edema, and claudication. ?Resp: Denies dyspnea at rest, dyspnea with exercise, cough, sputum, wheezing, coughing up blood, and pleurisy. ?GI: Denies vomiting blood, jaundice, and fecal incontinence.   Denies dysphagia or odynophagia. ?GU : Denies urinary burning, blood in urine, urinary frequency, urinary hesitancy, nocturnal urination, and urinary incontinence. ?MS: Denies joint pain, limitation of movement, and swelling, stiffness, low back pain, extremity pain. Denies muscle weakness, cramps, atrophy.  ?Derm: Denies rash, itching, dry skin, hives, moles, warts, or unhealing ulcers.  ?Psych: Denies depression, anxiety, memory loss, suicidal ideation, hallucinations, paranoia, and confusion. ?Heme: Denies bruising, bleeding, and enlarged lymph nodes. ?Neuro:  Denies any headaches, dizziness, paresthesias. ?Endo:  Denies any problems with DM, thyroid, adrenal function. ? ? ?Physical Exam: ?General:  Alert, well-developed, in NAD ?Head:  Normocephalic and atraumatic. ?Eyes:  Sclera clear, no icterus.   Conjunctiva pink. ?Ears:  Normal auditory acuity. ?Mouth:  No deformity or lesions.  ?Neck:  Supple; no masses . ?Lungs:  Clear throughout to auscultation.   No wheezes, crackles, or rhonchi. No acute distress. ?Heart:  Regular rate and rhythm; no murmurs. ?Abdomen:  Soft, nondistended, nontender. No masses, hepatomegaly. No obvious masses.  Normal bowel .    ?Rectal:  Deferred   ?Msk:  Symmetrical without gross deformities.Marland Kitchen ?Pulses:  Normal pulses noted. ?Extremities:  Without edema. ?Neurologic:  Alert and  oriented x4;  grossly normal neurologically. ?Skin:  Intact without significant lesions or rashes. ?Cervical Nodes:  No significant cervical adenopathy. ?Psych:  Alert and cooperative.  Normal mood and affect. ? ?Impression / Plan:  ? ?CRC screening, average risk, for colonoscopy. ? ?Laqueisha Catalina T. Fuller Plan  06/02/2021, 8:00 AM ?See Shea Evans, [facility], to contact our on call provider ? ? ?  ?

## 2021-06-02 NOTE — Progress Notes (Signed)
Report to PACU, RN, vss, BBS= Clear.  

## 2021-06-02 NOTE — Progress Notes (Signed)
? ?06/04/2021 ?2:06 PM  ? ?Elsworth Soho ?[birth year] ?[id number] ? ?Referring provider:  ?McLean-Scocuzza, Nino Glow, MD ?[address] ?[place],  [place] [address] ?Chief Complaint  ?Patient presents with  ? Hypogonadism  ? ? ?HPI: ?Nathan Fitzgerald is a 46 y.o.male who presents today for further evaluation of low testosterone.  ? ?He was seen by his PCP, Dr Mclean-Scocuzza, on 04/11/2021 at the time he was having reduced libido. His testosterone was checked on 05/15/2021 and was 266.78. He was referred here.  ? ?He reports decreased libido, energy, and strength. He has noticed these symptoms over the past month. He reports adequate erections but the interest is not there. He reports hair loss.  ? ?He is currently a smoker.  ? ? Androgen Deficiency in the Aging Male   ? ? Ila Name 06/04/21 0900  ?  ?  ?  ? Androgen Deficiency in the Aging Male  ? Do you have a decrease in libido (sex drive) Yes    ? Do you have lack of energy Yes    ? Do you have a decrease in strength and/or endurance Yes    ? Have you lost height No    ? Have you noticed a decreased ?enjoyment of life? No    ? Are you sad and/or grumpy No    ? Are your erections less strong Yes    ? Have you noticed a recent deterioration in your ability to play sports No    ? Are you falling asleep after dinner No    ? Has there been a recent deterioration in your work performance No    ? ?  ?  ? ?  ? ? ? ?PMH: ?Past Medical History:  ?Diagnosis Date  ? Anxiety   ? Chicken pox   ? Heart murmur   ? slight  ? Hyperlipidemia   ? Hypertension   ? RMSF [facility] spotted fever)   ? as kid- also had several tic borne infections over the years  ? ? ?Surgical History: ?Past Surgical History:  ?Procedure Laterality Date  ? TYMPANOSTOMY TUBE PLACEMENT    ? as kid  ? ? ?Home Medications:  ?Allergies as of 06/04/2021   ?No Known Allergies ?  ? ?  ?Medication List  ?  ? ?  ? Accurate as of June 04, 2021  2:06 PM. If you have any questions, ask your nurse or doctor.  ?  ?  ? ?   ? ?STOP taking these medications   ? ?fluticasone 50 MCG/ACT nasal spray ?Commonly known as: Flonase ?Stopped by: Hollice Espy, MD ?  ? ?  ? ?TAKE these medications   ? ?amLODipine 5 MG tablet ?Commonly known as: Norvasc ?Take 1 tablet (5 mg total) by mouth daily. ?  ?atorvastatin 10 MG tablet ?Commonly known as: LIPITOR ?Take 1 tablet (10 mg total) by mouth daily. After 6 pm ?  ?buPROPion 300 MG 24 hr tablet ?Commonly known as: Wellbutrin XL ?Take 1 tablet (300 mg total) by mouth daily. ?  ?busPIRone 10 MG tablet ?Commonly known as: BUSPAR ?Take 1 tablet (10 mg total) by mouth 2 (two) times daily. ?  ?CENTRUM SILVER 50+MEN PO ?Take by mouth daily at 12 noon. ?  ?losartan-hydrochlorothiazide 100-12.5 MG tablet ?Commonly known as: HYZAAR ?Take 1 tablet by mouth daily. In am d/c 50-12.5 mg qd ?  ? ?  ? ? ?Allergies:  ?No Known Allergies ? ?Family History: ?Family History  ?Adopted: Yes  ?  Family history unknown: Yes  ? ? ?Social History:  reports that he has quit smoking. He has never used smokeless tobacco. He reports current alcohol use of about 5.0 standard drinks per week. He reports that he does not currently use drugs. ? ? ?Physical Exam: ?BP 133/85   Pulse 68   Ht 6' (1.829 m)   Wt 197 lb (89.4 kg)   BMI 26.72 kg/m?   ?Constitutional:  Alert and oriented, No acute distress. ?HEENT: [place] AT, moist mucus membranes.  Trachea midline, no masses. ?Cardiovascular: No clubbing, cyanosis, or edema. ?GU: Bilateral descended testicles.  Normal phallus.  GU exam unremarkable. ?Respiratory: Normal respiratory effort, no increased work of breathing. ?Skin: No rashes, bruises or suspicious lesions. ?Neurologic: Grossly intact, no focal deficits, moving all 4 extremities. ?Psychiatric: Normal mood and affect. ? ?Laboratory Data: ?Lab Results  ?Component Value Date  ? CREATININE 1.01 03/13/2021  ? ? ?Assessment & Plan:   ?Testosterone deficiency  ?-Symptomatic and does desire intervention ?-We discussed lifestyle  modification as well as avoidance of marijuana which may be contributing to his low testosterone levels ?- We discussed testosterone deficiency in detail today  ?- Will further evaluate with repeat labs including LH, FSH, prolactin and estradiol ?-If these are unremarkable except for low testosterone, we discussed various treatment options including Clomid, exogenous testosterone's and the various forms of this.  He is leaning towards Clomid.  We discussed possible side effects and need to monitor liver enzymes.  He is agreeable with this plan.  We will call in this prescription and recheck labs in 3 months pending his repeat labs.  He is agreeable this plan. ? ?Call with lab results and follow-up plan ? ?Conley Rolls as a scribe for Hollice Espy, MD.,have documented all relevant documentation on the behalf of Hollice Espy, MD,as directed by  Hollice Espy, MD while in the presence of Hollice Espy, MD. ? ?I have reviewed the above documentation for accuracy and completeness, and I agree with the above.  ? ?Hollice Espy, MD ? ?[facility] ?[address], [address] ?[place], [place] [address] ?[phone] ? ?

## 2021-06-02 NOTE — Progress Notes (Signed)
Called to room to assist during endoscopic procedure.  Patient ID and intended procedure confirmed with present staff. Received instructions for my participation in the procedure from the performing physician.  

## 2021-06-02 NOTE — Patient Instructions (Signed)
Please read handouts provided. ?Continue present medications. ?Await pathology results. ?High Fiber Diet. ? ? ? ?YOU HAD AN ENDOSCOPIC PROCEDURE TODAY AT [facility]:   Refer to the procedure report that was given to you for any specific questions about what was found during the examination.  If the procedure report does not answer your questions, please call your gastroenterologist to clarify.  If you requested that your care partner not be given the details of your procedure findings, then the procedure report has been included in a sealed envelope for you to review at your convenience later. ? ?YOU SHOULD EXPECT: Some feelings of bloating in the abdomen. Passage of more gas than usual.  Walking can help get rid of the air that was put into your GI tract during the procedure and reduce the bloating. If you had a lower endoscopy (such as a colonoscopy or flexible sigmoidoscopy) you may notice spotting of blood in your stool or on the toilet paper. If you underwent a bowel prep for your procedure, you may not have a normal bowel movement for a few days. ? ?Please Note:  You might notice some irritation and congestion in your nose or some drainage.  This is from the oxygen used during your procedure.  There is no need for concern and it should clear up in a day or so. ? ?SYMPTOMS TO REPORT IMMEDIATELY: ? ?Following lower endoscopy (colonoscopy or flexible sigmoidoscopy): ? Excessive amounts of blood in the stool ? Significant tenderness or worsening of abdominal pains ? Swelling of the abdomen that is new, acute ? Fever of 100?F or higher ? ? ?For urgent or emergent issues, a gastroenterologist can be reached at any hour by calling [phone]. ?Do not use MyChart messaging for urgent concerns.  ? ? ?DIET:  We do recommend a small meal at first, but then you may proceed to your regular diet.  Drink plenty of fluids but you should avoid alcoholic beverages for 24 hours. ? ?ACTIVITY:  You should  plan to take it easy for the rest of today and you should NOT DRIVE or use heavy machinery until tomorrow (because of the sedation medicines used during the test).   ? ?FOLLOW UP: ?Our staff will call the number listed on your records 48-72 hours following your procedure to check on you and address any questions or concerns that you may have regarding the information given to you following your procedure. If we do not reach you, we will leave a message.  We will attempt to reach you two times.  During this call, we will ask if you have developed any symptoms of COVID 19. If you develop any symptoms (ie: fever, flu-like symptoms, shortness of breath, cough etc.) before then, please call [phone].  If you test positive for Covid 19 in the 2 weeks post procedure, please call and report this information to [place].   ? ?If any biopsies were taken you will be contacted by phone or by letter within the next 1-3 weeks.  Please call us at [phone] if you have not heard about the biopsies in 3 weeks.  ? ? ?SIGNATURES/CONFIDENTIALITY: ?You and/or your care partner have signed paperwork which will be entered into your electronic medical record.  These signatures attest to the fact that that the information above on your After Visit Summary has been reviewed and is understood.  Full responsibility of the confidentiality of this discharge information lies with you and/or your care-partner.  ?

## 2021-06-02 NOTE — Progress Notes (Signed)
Pt's states no medical or surgical changes since previsit or office visit. VS assessed by C.W 

## 2021-06-02 NOTE — Op Note (Signed)
[facility] ?Patient Name: Nathan Fitzgerald ?Procedure Date: 06/02/2021 8:00 AM ?MRN: [id number] ?Endoscopist: Ladene Artist , MD ?Age: 46 ?Referring MD:  ?Date of Birth: 1975/12/26 ?Gender: Male ?Account #: [id number] ?Procedure:                Colonoscopy ?Indications:              Screening for colorectal malignant neoplasm ?Medicines:                Monitored Anesthesia Care ?Procedure:                Pre-Anesthesia Assessment: ?                          - Prior to the procedure, a History and Physical  ?                          was performed, and patient medications and  ?                          allergies were reviewed. The patient's tolerance of  ?                          previous anesthesia was also reviewed. The risks  ?                          and benefits of the procedure and the sedation  ?                          options and risks were discussed with the patient.  ?                          All questions were answered, and informed consent  ?                          was obtained. Prior Anticoagulants: The patient has  ?                          taken no previous anticoagulant or antiplatelet  ?                          agents. ASA Grade Assessment: II - A patient with  ?                          mild systemic disease. After reviewing the risks  ?                          and benefits, the patient was deemed in  ?                          satisfactory condition to undergo the procedure. ?                          After obtaining informed consent, the colonoscope  ?  was passed under direct vision. Throughout the  ?                          procedure, the patient's blood pressure, pulse, and  ?                          oxygen saturations were monitored continuously. The  ?                          CF [id number] #[id number] was introduced through the anus  ?                          and advanced to the the cecum, identified by  ?                          appendiceal orifice  and ileocecal valve. The  ?                          ileocecal valve, appendiceal orifice, and rectum  ?                          were photographed. The quality of the bowel  ?                          preparation was good. The colonoscopy was performed  ?                          without difficulty. The patient tolerated the  ?                          procedure well. ?Scope In: 8:05:53 AM ?Scope Out: 8:20:03 AM ?Scope Withdrawal Time: 0 hours 12 minutes 35 seconds  ?Total Procedure Duration: 0 hours 14 minutes 10 seconds  ?Findings:                 The perianal and digital rectal examinations were  ?                          normal. ?                          Three sessile polyps were found in the sigmoid  ?                          colon, transverse colon and ascending colon. The  ?                          polyps were 7 mm in size. These polyps were removed  ?                          with a cold snare. Resection and retrieval were  ?                          complete. ?  A few small-mouthed diverticula were found in the  ?                          left colon. There was no evidence of diverticular  ?                          bleeding. ?                          Internal hemorrhoids were found during  ?                          retroflexion. The hemorrhoids were small and Grade  ?                          I (internal hemorrhoids that do not prolapse). ?                          The exam was otherwise without abnormality on  ?                          direct and retroflexion views. ?Complications:            No immediate complications. Estimated blood loss:  ?                          None. ?Estimated Blood Loss:     Estimated blood loss: none. ?Impression:               - Three 7 mm polyps in the sigmoid colon, in the  ?                          transverse colon and in the ascending colon,  ?                          removed with a cold snare. Resected and retrieved. ?                           - Mild diverticulosis in the left colon. ?                          - Internal hemorrhoids. ?                          - The examination was otherwise normal on direct  ?                          and retroflexion views. ?Recommendation:           - Repeat colonoscopy after studies are complete for  ?                          surveillance based on pathology results. ?                          - Patient has a contact number available for  ?  emergencies. The signs and symptoms of potential  ?                          delayed complications were discussed with the  ?                          patient. Return to normal activities tomorrow.  ?                          Written discharge instructions were provided to the  ?                          patient. ?                          - High fiber diet. ?                          - Continue present medications. ?                          - Await pathology results. ?Ladene Artist, MD ?06/02/2021 8:26:51 AM ?This report has been signed electronically. ?

## 2021-06-04 ENCOUNTER — Telehealth: Payer: Self-pay | Admitting: *Deleted

## 2021-06-04 ENCOUNTER — Ambulatory Visit: Payer: 59 | Admitting: Urology

## 2021-06-04 VITALS — BP 133/85 | HR 68 | Ht 72.0 in | Wt 197.0 lb

## 2021-06-04 DIAGNOSIS — R6889 Other general symptoms and signs: Secondary | ICD-10-CM

## 2021-06-04 DIAGNOSIS — E291 Testicular hypofunction: Secondary | ICD-10-CM | POA: Diagnosis not present

## 2021-06-04 DIAGNOSIS — R7989 Other specified abnormal findings of blood chemistry: Secondary | ICD-10-CM

## 2021-06-04 LAB — URINALYSIS, COMPLETE
Bilirubin, UA: NEGATIVE
Glucose, UA: NEGATIVE
Ketones, UA: NEGATIVE
Leukocytes,UA: NEGATIVE
Nitrite, UA: NEGATIVE
Protein,UA: NEGATIVE
RBC, UA: NEGATIVE
Specific Gravity, UA: >1.03 — ABNORMAL HIGH (ref 1.005–1.030)
Urobilinogen, Ur: 0.2 mg/dL (ref 0.2–1.0)
pH, UA: 5.5 (ref 5.0–7.5)

## 2021-06-04 LAB — MICROSCOPIC EXAMINATION: Bacteria, UA: NONE SEEN

## 2021-06-04 NOTE — Telephone Encounter (Signed)
No answer for post procedure followup call. Left VM. ?

## 2021-06-04 NOTE — Telephone Encounter (Signed)
?  Follow up Call- ? ? ?  06/02/2021  ?  7:03 AM  ?Call back number  ?Post procedure Call Back phone  # [phone]  ?Permission to leave phone message Yes  ?  ? ?Patient questions: ?Message left to call us if necessary. ?

## 2021-06-05 ENCOUNTER — Encounter: Payer: Self-pay | Admitting: Gastroenterology

## 2021-06-06 LAB — FSH/LH
FSH: 2 m[IU]/mL (ref 1.5–12.4)
LH: 3.6 m[IU]/mL (ref 1.7–8.6)

## 2021-06-06 LAB — PROLACTIN: Prolactin: 6.8 ng/mL (ref 4.0–15.2)

## 2021-06-06 LAB — TESTOSTERONE,FREE AND TOTAL
Testosterone, Free: 6.6 pg/mL — ABNORMAL LOW (ref 6.8–21.5)
Testosterone: 318 ng/dL (ref 264–916)

## 2021-06-06 LAB — ESTRADIOL: Estradiol: 19.6 pg/mL (ref 7.6–42.6)

## 2021-06-10 ENCOUNTER — Encounter: Payer: Self-pay | Admitting: Urology

## 2021-06-12 MED ORDER — CLOMIPHENE CITRATE 50 MG PO TABS
ORAL_TABLET | ORAL | 0 refills | Status: DC
Start: 2021-06-12 — End: 2021-10-21

## 2021-06-23 ENCOUNTER — Encounter: Payer: Self-pay | Admitting: Internal Medicine

## 2021-07-11 ENCOUNTER — Ambulatory Visit: Payer: 59 | Admitting: Internal Medicine

## 2021-08-21 ENCOUNTER — Other Ambulatory Visit: Payer: Self-pay

## 2021-08-21 DIAGNOSIS — R7989 Other specified abnormal findings of blood chemistry: Secondary | ICD-10-CM

## 2021-09-09 ENCOUNTER — Other Ambulatory Visit: Payer: Self-pay | Admitting: Internal Medicine

## 2021-09-09 ENCOUNTER — Telehealth: Payer: Self-pay | Admitting: Internal Medicine

## 2021-09-09 DIAGNOSIS — T753XXA Motion sickness, initial encounter: Secondary | ICD-10-CM

## 2021-09-09 MED ORDER — SCOPOLAMINE 1 MG/3DAYS TD PT72: 1.0000 | MEDICATED_PATCH | TRANSDERMAL | 0 refills | Status: DC

## 2021-09-09 NOTE — Telephone Encounter (Signed)
[facility]  from [facility] called about patient's motion sickness patch. They come in packages of 4. Can they fill for 4?

## 2021-09-09 NOTE — Telephone Encounter (Signed)
This is ok

## 2021-09-12 ENCOUNTER — Other Ambulatory Visit: Payer: 59

## 2021-09-16 ENCOUNTER — Ambulatory Visit: Payer: 59 | Admitting: Urology

## 2021-09-29 ENCOUNTER — Ambulatory Visit: Payer: 59 | Admitting: Dermatology

## 2021-09-29 DIAGNOSIS — W57XXXA Bitten or stung by nonvenomous insect and other nonvenomous arthropods, initial encounter: Secondary | ICD-10-CM | POA: Diagnosis not present

## 2021-09-29 DIAGNOSIS — S90561A Insect bite (nonvenomous), right ankle, initial encounter: Secondary | ICD-10-CM

## 2021-09-29 DIAGNOSIS — Z1283 Encounter for screening for malignant neoplasm of skin: Secondary | ICD-10-CM | POA: Diagnosis not present

## 2021-09-29 DIAGNOSIS — D229 Melanocytic nevi, unspecified: Secondary | ICD-10-CM | POA: Diagnosis not present

## 2021-09-29 DIAGNOSIS — D1800 Hemangioma unspecified site: Secondary | ICD-10-CM

## 2021-09-29 DIAGNOSIS — L578 Other skin changes due to chronic exposure to nonionizing radiation: Secondary | ICD-10-CM

## 2021-09-29 DIAGNOSIS — D492 Neoplasm of unspecified behavior of bone, soft tissue, and skin: Secondary | ICD-10-CM

## 2021-09-29 DIAGNOSIS — L821 Other seborrheic keratosis: Secondary | ICD-10-CM | POA: Diagnosis not present

## 2021-09-29 DIAGNOSIS — D239 Other benign neoplasm of skin, unspecified: Secondary | ICD-10-CM

## 2021-09-29 DIAGNOSIS — L814 Other melanin hyperpigmentation: Secondary | ICD-10-CM | POA: Diagnosis not present

## 2021-09-29 DIAGNOSIS — D225 Melanocytic nevi of trunk: Secondary | ICD-10-CM

## 2021-09-29 DIAGNOSIS — D485 Neoplasm of uncertain behavior of skin: Secondary | ICD-10-CM

## 2021-09-29 HISTORY — DX: Other benign neoplasm of skin, unspecified: D23.9

## 2021-09-29 NOTE — Progress Notes (Signed)
New Patient Visit  Subjective  Nathan Fitzgerald is a 46 y.o. male who presents for the following: Other (New patient - spots that come up on ankles when it's hot - he has phots but does not have any right now - The patient presents for Total-Body Skin Exam (TBSE) for skin cancer screening and mole check.  The patient has spots, moles and lesions to be evaluated, some may be new or changing and the patient has concerns that these could be cancer./).  The following portions of the chart were reviewed this encounter and updated as appropriate:   Tobacco  Allergies  Meds  Problems  Med Hx  Surg Hx  Fam Hx     Review of Systems:  No other skin or systemic complaints except as noted in HPI or Assessment and Plan.  Objective  Well appearing patient in no apparent distress; mood and affect are within normal limits.  A full examination was performed including scalp, head, eyes, ears, nose, lips, neck, chest, axillae, abdomen, back, buttocks, bilateral upper extremities, bilateral lower extremities, hands, feet, fingers, toes, fingernails, and toenails. All findings within normal limits unless otherwise noted below.  Right Ankle - Anterior Pink papule  Right UQA periumbilical lat Irregular brown macule 0.6 cm  Left Abdomen (side) - Upper Slightly irregular brown macule          Assessment & Plan   Lentigines - Scattered tan macules - Due to sun exposure - Benign-appearing, observe - Recommend daily broad spectrum sunscreen SPF 30+ to sun-exposed areas, reapply every 2 hours as needed. - Call for any changes  Seborrheic Keratoses - Stuck-on, waxy, tan-brown papules and/or plaques  - Benign-appearing - Discussed benign etiology and prognosis. - Observe - Call for any changes  Melanocytic Nevi - Tan-brown and/or pink-flesh-colored symmetric macules and papules - Benign appearing on exam today - Observation - Call clinic for new or changing moles - Recommend daily use  of broad spectrum spf 30+ sunscreen to sun-exposed areas.   Hemangiomas - Red papules - Discussed benign nature - Observe - Call for any changes  Actinic Damage - Chronic condition, secondary to cumulative UV/sun exposure - diffuse scaly erythematous macules with underlying dyspigmentation - Recommend daily broad spectrum sunscreen SPF 30+ to sun-exposed areas, reapply every 2 hours as needed.  - Staying in the shade or wearing long sleeves, sun glasses (UVA+UVB protection) and wide brim hats (4-inch brim around the entire circumference of the hat) are also recommended for sun protection.  - Call for new or changing lesions.  Skin cancer screening performed today.  Insect bite of right ankle with local reaction, initial encounter Right Ankle - Anterior Benign  Neoplasm of uncertain behavior of skin Right UQA periumbilical lat Epidermal / dermal shaving  Informed consent: discussed and consent obtained   Timeout: patient name, date of birth, surgical site, and procedure verified   Procedure prep:  Patient was prepped and draped in usual sterile fashion Prep type:  Isopropyl alcohol Anesthesia: the lesion was anesthetized in a standard fashion   Anesthetic:  1% lidocaine w/ epinephrine 1-100,000 buffered w/ 8.4% NaHCO3 Instrument used: flexible razor blade   Hemostasis achieved with: pressure, aluminum chloride and electrodesiccation   Outcome: patient tolerated procedure well   Post-procedure details: sterile dressing applied and wound care instructions given   Dressing type: bandage and petrolatum    Specimen 1 - Surgical pathology Differential Diagnosis: Nevus vs dyaplastic nevus Check Margins: No  Nevus Left Abdomen (side) -  Upper Benign-appearing.  Observation.  Call clinic for new or changing lesions.  Recommend daily use of broad spectrum spf 30+ sunscreen to sun-exposed areas.    Return pending biopsy results.  I, Ashok Cordia, CMA, am acting as scribe for Sarina Ser, MD . Documentation: I have reviewed the above documentation for accuracy and completeness, and I agree with the above.  Sarina Ser, MD

## 2021-09-29 NOTE — Patient Instructions (Signed)
Wound Care Instructions  Cleanse wound gently with soap and water once a day then pat dry with clean gauze. Apply a thin coat of Petrolatum (petroleum jelly, "Vaseline") over the wound (unless you have an allergy to this). We recommend that you use a new, sterile tube of Vaseline. Do not pick or remove scabs. Do not remove the yellow or white "healing tissue" from the base of the wound.  Cover the wound with fresh, clean, nonstick gauze and secure with paper tape. You may use Band-Aids in place of gauze and tape if the wound is small enough, but would recommend trimming much of the tape off as there is often too much. Sometimes Band-Aids can irritate the skin.  You should call the office for your biopsy report after 1 week if you have not already been contacted.  If you experience any problems, such as abnormal amounts of bleeding, swelling, significant bruising, significant pain, or evidence of infection, please call the office immediately.  FOR ADULT SURGERY PATIENTS: If you need something for pain relief you may take 1 extra strength Tylenol (acetaminophen) AND 2 Ibuprofen (200mg each) together every 4 hours as needed for pain. (do not take these if you are allergic to them or if you have a reason you should not take them.) Typically, you may only need pain medication for 1 to 3 days.    

## 2021-10-02 ENCOUNTER — Telehealth: Payer: Self-pay

## 2021-10-02 NOTE — Telephone Encounter (Signed)
-----   Message from Ralene Bathe, MD sent at 10/01/2021  5:57 PM EDT ----- Diagnosis Skin , right UQA periumbilical lat DYSPLASTIC NEVUS WITH MODERATE TO SEVERE ATYPIA, PERIPHERAL AND DEEP MARGINS INVOLVED, SEE DESCRIPTION  Moderate to severe dysplastic Schedule surgery

## 2021-10-02 NOTE — Telephone Encounter (Signed)
Left message on voicemail to return my call.  

## 2021-10-06 ENCOUNTER — Encounter: Payer: Self-pay | Admitting: Dermatology

## 2021-10-06 ENCOUNTER — Telehealth: Payer: Self-pay

## 2021-10-06 NOTE — Telephone Encounter (Signed)
-----   Message from Ralene Bathe, MD sent at 10/01/2021  5:57 PM EDT ----- Diagnosis Skin , right UQA periumbilical lat DYSPLASTIC NEVUS WITH MODERATE TO SEVERE ATYPIA, PERIPHERAL AND DEEP MARGINS INVOLVED, SEE DESCRIPTION  Moderate to severe dysplastic Schedule surgery

## 2021-10-06 NOTE — Telephone Encounter (Signed)
Patient informed of pathology results and appointment scheduled.  °

## 2021-10-07 ENCOUNTER — Encounter: Payer: 59 | Admitting: Dermatology

## 2021-10-07 ENCOUNTER — Encounter: Payer: Self-pay | Admitting: Dermatology

## 2021-10-07 NOTE — Progress Notes (Signed)
Patient left due to wait time

## 2021-10-07 NOTE — Progress Notes (Signed)
This encounter was created in error - please disregard.

## 2021-10-14 ENCOUNTER — Ambulatory Visit: Payer: 59 | Admitting: Dermatology

## 2021-10-14 ENCOUNTER — Encounter: Payer: Self-pay | Admitting: Dermatology

## 2021-10-14 DIAGNOSIS — D229 Melanocytic nevi, unspecified: Secondary | ICD-10-CM

## 2021-10-14 DIAGNOSIS — D225 Melanocytic nevi of trunk: Secondary | ICD-10-CM

## 2021-10-14 DIAGNOSIS — L988 Other specified disorders of the skin and subcutaneous tissue: Secondary | ICD-10-CM

## 2021-10-14 DIAGNOSIS — D235 Other benign neoplasm of skin of trunk: Secondary | ICD-10-CM

## 2021-10-14 DIAGNOSIS — D239 Other benign neoplasm of skin, unspecified: Secondary | ICD-10-CM

## 2021-10-14 DIAGNOSIS — D492 Neoplasm of unspecified behavior of bone, soft tissue, and skin: Secondary | ICD-10-CM

## 2021-10-14 HISTORY — DX: Melanocytic nevi, unspecified: D22.9

## 2021-10-14 MED ORDER — DOXYCYCLINE MONOHYDRATE 100 MG PO CAPS: 100.0000 mg | ORAL_CAPSULE | Freq: Two times a day (BID) | ORAL | 0 refills | Status: AC

## 2021-10-14 MED ORDER — MUPIROCIN 2 % EX OINT: 1.0000 | TOPICAL_OINTMENT | Freq: Every day | CUTANEOUS | 1 refills | Status: DC

## 2021-10-14 NOTE — Progress Notes (Unsigned)
Follow-Up Visit   Subjective  Nathan Fitzgerald is a 46 y.o. male who presents for the following: No chief complaint on file..   The following portions of the chart were reviewed this encounter and updated as appropriate:       Review of Systems:  No other skin or systemic complaints except as noted in HPI or Assessment and Plan.  Objective  Well appearing patient in no apparent distress; mood and affect are within normal limits.  A focused examination was performed including abdomen. Relevant physical exam findings are noted in the Assessment and Plan.  RUQA periumbilical lat Pink bx site 1.0 x 0.8cm  LLQA lat above the waistline Irregular brown macule 1.1cm  face Rhytides and volume loss.     Assessment & Plan  Dysplastic nevus RUQA periumbilical lat  Bx proven Moderate to Severe Dysplastic Nevus excised today Start Mupirocin oint qd to excision site Start Doxycycline '100mg'$  1 po bid for 7 days with food and drink   Doxycycline should be taken with food to prevent nausea. Do not lay down for 30 minutes after taking. Be cautious with sun exposure and use good sun protection while on this medication. Pregnant women should not take this medication.    Skin excision - RUQA periumbilical lat  Lesion length (cm):  1 Lesion width (cm):  0.8 Margin per side (cm):  0.2 Total excision diameter (cm):  1.4 Informed consent: discussed and consent obtained   Timeout: patient name, date of birth, surgical site, and procedure verified   Procedure prep:  Patient was prepped and draped in usual sterile fashion Prep type:  Isopropyl alcohol and povidone-iodine Anesthesia: the lesion was anesthetized in a standard fashion   Anesthetic:  1% lidocaine w/ epinephrine 1-100,000 buffered w/ 8.4% NaHCO3 (9cc lido w/ epi, 3cc bupivicaine, Total of 12cc) Instrument used: #15 blade   Hemostasis achieved with: pressure   Hemostasis achieved with comment:  Electrocautery Outcome: patient  tolerated procedure well with no complications   Post-procedure details: sterile dressing applied and wound care instructions given   Dressing type: bandage and pressure dressing (Mupirocin)    Skin repair - RUQA periumbilical lat Complexity:  Complex Final length (cm):  4 Reason for type of repair: reduce tension to allow closure, reduce the risk of dehiscence, infection, and necrosis, reduce subcutaneous dead space and avoid a hematoma, allow closure of the large defect, preserve normal anatomy, preserve normal anatomical and functional relationships and enhance both functionality and cosmetic results   Undermining: area extensively undermined   Undermining comment:  Undermining Defect 1.4cm Subcutaneous layers (deep stitches):  Suture size:  3-0 Suture type: Vicryl (polyglactin 910)   Subcutaneous suture technique: Inverted Dermal. Fine/surface layer approximation (top stitches):  Suture size:  3-0 Suture type: nylon   Stitches: simple running   Suture removal (days):  7 Hemostasis achieved with: pressure Outcome: patient tolerated procedure well with no complications   Post-procedure details: sterile dressing applied and wound care instructions given   Dressing type: bandage, pressure dressing and bacitracin (Mupirocin)    mupirocin ointment (BACTROBAN) 2 % - RUQA periumbilical lat Apply 1 Application topically daily. Qd to excision site  doxycycline (MONODOX) 100 MG capsule - RUQA periumbilical lat Take 1 capsule (100 mg total) by mouth 2 (two) times daily for 7 days. Take with food and drink  Specimen 1 - Surgical pathology Differential Diagnosis: D48.5 Bx proven Moderate to severe Dysplastic nevus  Check Margins: yes Pink bx site 1.0 x 0.8cm [id number]  Neoplasm of skin LLQA lat above the waistline  Epidermal / dermal shaving  Lesion diameter (cm):  1.1 Informed consent: discussed and consent obtained   Timeout: patient name, date of birth, surgical site, and  procedure verified   Procedure prep:  Patient was prepped and draped in usual sterile fashion Prep type:  Isopropyl alcohol Anesthesia: the lesion was anesthetized in a standard fashion   Anesthetic:  1% lidocaine w/ epinephrine 1-100,000 buffered w/ 8.4% NaHCO3 Instrument used: flexible razor blade   Hemostasis achieved with: pressure, aluminum chloride and electrodesiccation   Outcome: patient tolerated procedure well   Post-procedure details: sterile dressing applied and wound care instructions given   Dressing type: bandage and bacitracin    Specimen 2 - Surgical pathology Differential Diagnosis: D48.5 Nevus vs Dysplastic Nevus  Check Margins: yes Irregular brown macule 1.1cm  Elastosis of skin face  Discussed Botox, recommend 30 units to frown complex   Return in about 1 week (around 10/21/2021) for suture removal.  I, Nathan Fitzgerald, RMA, am acting as scribe for Sarina Ser, MD .

## 2021-10-14 NOTE — Patient Instructions (Addendum)

## 2021-10-15 ENCOUNTER — Telehealth: Payer: Self-pay

## 2021-10-15 ENCOUNTER — Encounter: Payer: Self-pay | Admitting: Dermatology

## 2021-10-15 NOTE — Telephone Encounter (Signed)
Called [facility] and asked the following: " where the  positive margin is ?  Middle or tip or in between?  Is it on more than one edge? "  Asked them to call and advise./sh

## 2021-10-15 NOTE — Telephone Encounter (Signed)
Left pt msg to call if any problems after yesterday's surgery./sh 

## 2021-10-17 ENCOUNTER — Encounter: Payer: Self-pay | Admitting: Dermatology

## 2021-10-21 ENCOUNTER — Ambulatory Visit (INDEPENDENT_AMBULATORY_CARE_PROVIDER_SITE_OTHER): Payer: 59 | Admitting: Dermatology

## 2021-10-21 DIAGNOSIS — Z1283 Encounter for screening for malignant neoplasm of skin: Secondary | ICD-10-CM

## 2021-10-21 DIAGNOSIS — D235 Other benign neoplasm of skin of trunk: Secondary | ICD-10-CM

## 2021-10-21 DIAGNOSIS — D239 Other benign neoplasm of skin, unspecified: Secondary | ICD-10-CM

## 2021-10-21 NOTE — Patient Instructions (Signed)
After Suture Removal  If your medical team has placed Steri-Strips (white adhesive strips covering the surgical site to provide extra support): Keep the area dry until they fall off.  Do not peel them off. Just let them fall off on their own.  If the edges peel up, you can trim them with scissors.   If your team has not placed Steri-Strips: Wash the area daily with soap and water. Then coat the incision site with plain Vaseline and cover with a bandage. Do this daily for 5 days after the sutures are removed. After that, no additional wound care is generally needed.  However, if you would like to help fade the scar, you can apply a silicone scar cream, gel or sheet every night. The scar will remodel for one year after the procedure. If a skin cancer was removed, be sure to keep your appointment with your dermatologist for follow-up and let your dermatology team know if you have any new or changing spots between visits.    Please call our office at [phone] for any questions or concerns.       Due to recent changes in healthcare laws, you may see results of your pathology and/or laboratory studies on MyChart before the doctors have had a chance to review them. We understand that in some cases there may be results that are confusing or concerning to you. Please understand that not all results are received at the same time and often the doctors may need to interpret multiple results in order to provide you with the best plan of care or course of treatment. Therefore, we ask that you please give us 2 business days to thoroughly review all your results before contacting the office for clarification. Should we see a critical lab result, you will be contacted sooner.   If You Need Anything After Your Visit  If you have any questions or concerns for your doctor, please call our main line at [phone] and press option 4 to reach your doctor's medical assistant. If no one answers, please leave  a voicemail as directed and we will return your call as soon as possible. Messages left after 4 pm will be answered the following business day.   You may also send us a message via MyChart. We typically respond to MyChart messages within 1-2 business days.  For prescription refills, please ask your pharmacy to contact our office. Our fax number is [phone].  If you have an urgent issue when the clinic is closed that cannot wait until the next business day, you can page your doctor at the number below.    Please note that while we do our best to be available for urgent issues outside of office hours, we are not available 24/7.   If you have an urgent issue and are unable to reach us, you may choose to seek medical care at your doctor's office, retail clinic, urgent care center, or emergency room.  If you have a medical emergency, please immediately call 911 or go to the emergency department.  Pager Numbers  - Dr. Kowalski: [phone]  - Dr. Moye: [phone]  - Dr. Stewart: [phone]  In the event of inclement weather, please call our main line at [phone] for an update on the status of any delays or closures.  Dermatology Medication Tips: Please keep the boxes that topical medications come in in order to help keep track of the instructions about where and how to use these. Pharmacies typically print the medication   instructions only on the boxes and not directly on the medication tubes.   If your medication is too expensive, please contact our office at [phone] option 4 or send us a message through MyChart.   We are unable to tell what your co-pay for medications will be in advance as this is different depending on your insurance coverage. However, we may be able to find a substitute medication at lower cost or fill out paperwork to get insurance to cover a needed medication.   If a prior authorization is required to get your medication covered by your insurance  company, please allow us 1-2 business days to complete this process.  Drug prices often vary depending on where the prescription is filled and some pharmacies may offer cheaper prices.  The website www.goodrx.com contains coupons for medications through different pharmacies. The prices here do not account for what the cost may be with help from insurance (it may be cheaper with your insurance), but the website can give you the price if you did not use any insurance.  - You can print the associated coupon and take it with your prescription to the pharmacy.  - You may also stop by our office during regular business hours and pick up a GoodRx coupon card.  - If you need your prescription sent electronically to a different pharmacy, notify our office through [facility] MyChart or by phone at [phone] option 4.     Si Usted Necesita Algo Despus de Su Visita  Tambin puede enviarnos un mensaje a travs de MyChart. Por lo general respondemos a los mensajes de MyChart en el transcurso de 1 a 2 das hbiles.  Para renovar recetas, por favor pida a su farmacia que se ponga en contacto con nuestra oficina. Nuestro nmero de fax es el [phone].  Si tiene un asunto urgente cuando la clnica est cerrada y que no puede esperar hasta el siguiente da hbil, puede llamar/localizar a su doctor(a) al nmero que aparece a continuacin.   Por favor, tenga en cuenta que aunque hacemos todo lo posible para estar disponibles para asuntos urgentes fuera del horario de oficina, no estamos disponibles las 24 horas del da, los 7 das de la semana.   Si tiene un problema urgente y no puede comunicarse con nosotros, puede optar por buscar atencin mdica  en el consultorio de su doctor(a), en una clnica privada, en un centro de atencin urgente o en una sala de emergencias.  Si tiene una emergencia mdica, por favor llame inmediatamente al 911 o vaya a la sala de emergencias.  Nmeros de bper  - Dr.  Kowalski: [phone]  - Dra. Moye: [phone]  - Dra. Stewart: [phone]  En caso de inclemencias del tiempo, por favor llame a nuestra lnea principal al [phone] para una actualizacin sobre el estado de cualquier retraso o cierre.  Consejos para la medicacin en dermatologa: Por favor, guarde las cajas en las que vienen los medicamentos de uso tpico para ayudarle a seguir las instrucciones sobre dnde y cmo usarlos. Las farmacias generalmente imprimen las instrucciones del medicamento slo en las cajas y no directamente en los tubos del medicamento.   Si su medicamento es muy caro, por favor, pngase en contacto con nuestra oficina llamando al [phone] y presione la opcin 4 o envenos un mensaje a travs de MyChart.   No podemos decirle cul ser su copago por los medicamentos por adelantado ya que esto es diferente dependiendo de la cobertura de su seguro. Sin embargo,   es posible que podamos encontrar un medicamento sustituto a menor costo o llenar un formulario para que el seguro cubra el medicamento que se considera necesario.   Si se requiere una autorizacin previa para que su compaa de seguros cubra su medicamento, por favor permtanos de 1 a 2 das hbiles para completar este proceso.  Los precios de los medicamentos varan con frecuencia dependiendo del lugar de dnde se surte la receta y alguna farmacias pueden ofrecer precios ms baratos.  El sitio web www.goodrx.com tiene cupones para medicamentos de diferentes farmacias. Los precios aqu no tienen en cuenta lo que podra costar con la ayuda del seguro (puede ser ms barato con su seguro), pero el sitio web puede darle el precio si no utiliz ningn seguro.  - Puede imprimir el cupn correspondiente y llevarlo con su receta a la farmacia.  - Tambin puede pasar por nuestra oficina durante el horario de atencin regular y recoger una tarjeta de cupones de GoodRx.  - Si necesita que su receta se enve  electrnicamente a una farmacia diferente, informe a nuestra oficina a travs de MyChart de [place] o por telfono llamando al [phone] y presione la opcin 4.  

## 2021-10-21 NOTE — Progress Notes (Signed)
   Follow-Up Visit   Subjective  Nathan Fitzgerald is a 46 y.o. male who presents for the following: Suture / Staple Removal (1 week suture removal at right lateral periumbilical. Patient reports no redness or other signs of infections. ).   The following portions of the chart were reviewed this encounter and updated as appropriate:      Review of Systems: No other skin or systemic complaints except as noted in HPI or Assessment and Plan.   Objective  Well appearing patient in no apparent distress; mood and affect are within normal limits.  A focused examination was performed including RUQA periumbilical lat and LLQA lat above the waistline. Relevant physical exam findings are noted in the Assessment and Plan.  LLQA lateral above the waistline, RUQA periumbilical lateral Healing bx excision   Assessment & Plan  Dysplastic nevus (2) RUQA periumbilical lateral; LLQA lateral above the waistline  Bx proven :  at RUQA periumbilical lateral The dysplastic nevus extends to one peripheral margin in the cross sectional section. The radial margins are not involved. Moderate to Severe Dysplastic + Margin One edge in middle part of excision involved.  Radial edges clear. We will discuss further at next visit. (Options are observation vs re-biopsy of edge vs re-excision -- favor observation or re-biosy of edge in middle of excision scar - but do not recommend doing anything until area is fully healed  Discussed Dr. Nehemiah Massed will be made aware patient is need for follow up appointment and will discuss results further.   LLQA lat above the waistline - DYSPLASTIC COMPOUND NEVUS WITH MODERATE ATYPIA, PERIPHERAL MARGIN INVOLVED  Related Medications mupirocin ointment (BACTROBAN) 2 % Apply 1 Application topically daily. Qd to excision site  doxycycline (MONODOX) 100 MG capsule Take 1 capsule (100 mg total) by mouth 2 (two) times daily for 7 days. Take with food and drink  Encounter for Removal  of Sutures - Incision site at the right lateral periumbilical  is clean, dry and intact - Wound cleansed, sutures removed, wound cleansed and steri strips applied.  - Discussed pathology results showing dysplastic nevus extends to one peripheral margin in the cross sectional section. The peripheral margin is involved.  - Patient advised to keep steri-strips dry until they fall off. - Scars remodel for a full year. - Once steri-strips fall off, patient can apply over-the-counter silicone scar cream each night to help with scar remodeling if desired. - Patient advised to call with any concerns or if they notice any new or changing lesions.  Return for will call to schedule follow up.  I, Ruthell Rummage, CMA, am acting as scribe for Brendolyn Patty, MD.  Documentation: I have reviewed the above documentation for accuracy and completeness, and I agree with the above.  Brendolyn Patty MD

## 2021-10-22 ENCOUNTER — Telehealth: Payer: Self-pay

## 2021-10-22 NOTE — Telephone Encounter (Signed)
-----   Message from Ralene Bathe, MD sent at 10/16/2021  2:01 PM EDT ----- Diagnosis 1. Skin (M), RUQA periumbilical lat RESIDUAL DYSPLASTIC NEVUS, PERIPHERAL MARGIN INVOLVED  ADDENDUM: The dysplastic nevus extends to one peripheral margin in the cross sectional section. The radial margins are not involved. (MM:kh 10/16/21)  Site of Moderate to Severe Dysplastic + Margin One edge in middle part of excision involved.  Radial edges clear. We will discuss further at next visit. (Options are observation vs re-biopsy of edge vs re-excision -- favor observation or re-biosy of edge in middle of excision scar - but do not recommend doing anything until area is fully healed)

## 2021-10-22 NOTE — Telephone Encounter (Signed)
Discussed bx results and scheduled pt for 7mf/u to re-evaluate RUQA periumbilcal lat with + margin.  Advised pt that Dr. KNehemiah Massedcould call and discuss with patient and he said he was ok discussing at f/u in 2 months.

## 2021-12-15 ENCOUNTER — Encounter: Payer: Self-pay | Admitting: Dermatology

## 2021-12-16 NOTE — Telephone Encounter (Signed)
Called patient and offered appt for today or tomorrow. Patient declined. aw

## 2021-12-25 ENCOUNTER — Ambulatory Visit: Payer: 59 | Admitting: Dermatology

## 2021-12-25 DIAGNOSIS — D225 Melanocytic nevi of trunk: Secondary | ICD-10-CM | POA: Diagnosis not present

## 2021-12-25 DIAGNOSIS — D239 Other benign neoplasm of skin, unspecified: Secondary | ICD-10-CM

## 2021-12-25 DIAGNOSIS — D235 Other benign neoplasm of skin of trunk: Secondary | ICD-10-CM

## 2021-12-25 DIAGNOSIS — Z86018 Personal history of other benign neoplasm: Secondary | ICD-10-CM | POA: Diagnosis not present

## 2021-12-25 DIAGNOSIS — D229 Melanocytic nevi, unspecified: Secondary | ICD-10-CM

## 2021-12-25 NOTE — Progress Notes (Signed)
   Follow-Up Visit   Subjective  Nathan Fitzgerald is a 46 y.o. male who presents for the following: Follow-up (Patient here today to have bx proven dysplastic nevus with positive margins checked at right upper quadrant. ).  The following portions of the chart were reviewed this encounter and updated as appropriate:  Tobacco  Allergies  Meds  Problems  Med Hx  Surg Hx  Fam Hx     Review of Systems: No other skin or systemic complaints except as noted in HPI or Assessment and Plan.  Objective  Well appearing patient in no apparent distress; mood and affect are within normal limits.  A focused examination was performed including Chest, back, abdomen,. Relevant physical exam findings are noted in the Assessment and Plan.  trunk, abdomen, back Multiple nevi at chest, abdomen, trunk and back                          Assessment & Plan  Dysplastic nevus RUQA periumbilical lateral The dysplastic nevus extends to one peripheral margin in the cross sectional section. The radial margins are not involved. Moderate to Severe Dysplastic + Margin One edge in middle part of excision involved.  Radial edges clear. Discussed options with patient  Observation vs re-biopsy of edges on both sides of middle of excision scar vs re-excision   Favor observation or re-biopsy of edge in middle of the excision scar  Do not recommend doing anything until area is fully healed.   Examined under dermatoscope - no pigment or color seen  Will continue to monitor at this time. Will recheck at next follow.      Related Medications mupirocin ointment (BACTROBAN) 2 % Apply 1 Application topically daily. Qd to excision site  Nevus trunk, abdomen, back Multiple moles Benign-appearing.  Observation.  Call clinic for new or changing lesions.  Recommend daily use of broad spectrum spf 30+ sunscreen to sun-exposed areas.   Photos taken today  Will continue to monitor  History of  Dysplastic Nevi At [facility] lateral above the waistline  - No evidence of recurrence today - Recommend regular full body skin exams - Recommend daily broad spectrum sunscreen SPF 30+ to sun-exposed areas, reapply every 2 hours as needed.  - Call if any new or changing lesions are noted between office visits  Return in about 6 months (around 06/25/2022) for tbse hx of dysplastic . IRuthell Rummage, CMA, am acting as scribe for Sarina Ser, MD. Documentation: I have reviewed the above documentation for accuracy and completeness, and I agree with the above.  Sarina Ser, MD

## 2021-12-25 NOTE — Patient Instructions (Signed)
Due to recent changes in healthcare laws, you may see results of your pathology and/or laboratory studies on MyChart before the doctors have had a chance to review them. We understand that in some cases there may be results that are confusing or concerning to you. Please understand that not all results are received at the same time and often the doctors may need to interpret multiple results in order to provide you with the best plan of care or course of treatment. Therefore, we ask that you please give us 2 business days to thoroughly review all your results before contacting the office for clarification. Should we see a critical lab result, you will be contacted sooner.   If You Need Anything After Your Visit  If you have any questions or concerns for your doctor, please call our main line at [phone] and press option 4 to reach your doctor's medical assistant. If no one answers, please leave a voicemail as directed and we will return your call as soon as possible. Messages left after 4 pm will be answered the following business day.   You may also send us a message via MyChart. We typically respond to MyChart messages within 1-2 business days.  For prescription refills, please ask your pharmacy to contact our office. Our fax number is [phone].  If you have an urgent issue when the clinic is closed that cannot wait until the next business day, you can page your doctor at the number below.    Please note that while we do our best to be available for urgent issues outside of office hours, we are not available 24/7.   If you have an urgent issue and are unable to reach us, you may choose to seek medical care at your doctor's office, retail clinic, urgent care center, or emergency room.  If you have a medical emergency, please immediately call 911 or go to the emergency department.  Pager Numbers  - Dr. Kowalski: [phone]  - Dr. Moye: [phone]  - Dr. Stewart:  [phone]  In the event of inclement weather, please call our main line at [phone] for an update on the status of any delays or closures.  Dermatology Medication Tips: Please keep the boxes that topical medications come in in order to help keep track of the instructions about where and how to use these. Pharmacies typically print the medication instructions only on the boxes and not directly on the medication tubes.   If your medication is too expensive, please contact our office at [phone] option 4 or send us a message through MyChart.   We are unable to tell what your co-pay for medications will be in advance as this is different depending on your insurance coverage. However, we may be able to find a substitute medication at lower cost or fill out paperwork to get insurance to cover a needed medication.   If a prior authorization is required to get your medication covered by your insurance company, please allow us 1-2 business days to complete this process.  Drug prices often vary depending on where the prescription is filled and some pharmacies may offer cheaper prices.  The website www.goodrx.com contains coupons for medications through different pharmacies. The prices here do not account for what the cost may be with help from insurance (it may be cheaper with your insurance), but the website can give you the price if you did not use any insurance.  - You can print the associated coupon and take it with   your prescription to the pharmacy.  - You may also stop by our office during regular business hours and pick up a GoodRx coupon card.  - If you need your prescription sent electronically to a different pharmacy, notify our office through [facility] MyChart or by phone at [phone] option 4.     Si Usted Necesita Algo Despus de Su Visita  Tambin puede enviarnos un mensaje a travs de MyChart. Por lo general respondemos a los mensajes de MyChart en el transcurso de 1 a 2  das hbiles.  Para renovar recetas, por favor pida a su farmacia que se ponga en contacto con nuestra oficina. Nuestro nmero de fax es el [phone].  Si tiene un asunto urgente cuando la clnica est cerrada y que no puede esperar hasta el siguiente da hbil, puede llamar/localizar a su doctor(a) al nmero que aparece a continuacin.   Por favor, tenga en cuenta que aunque hacemos todo lo posible para estar disponibles para asuntos urgentes fuera del horario de oficina, no estamos disponibles las 24 horas del da, los 7 das de la semana.   Si tiene un problema urgente y no puede comunicarse con nosotros, puede optar por buscar atencin mdica  en el consultorio de su doctor(a), en una clnica privada, en un centro de atencin urgente o en una sala de emergencias.  Si tiene una emergencia mdica, por favor llame inmediatamente al 911 o vaya a la sala de emergencias.  Nmeros de bper  - Dr. Kowalski: [phone]  - Dra. Moye: [phone]  - Dra. Stewart: [phone]  En caso de inclemencias del tiempo, por favor llame a nuestra lnea principal al [phone] para una actualizacin sobre el estado de cualquier retraso o cierre.  Consejos para la medicacin en dermatologa: Por favor, guarde las cajas en las que vienen los medicamentos de uso tpico para ayudarle a seguir las instrucciones sobre dnde y cmo usarlos. Las farmacias generalmente imprimen las instrucciones del medicamento slo en las cajas y no directamente en los tubos del medicamento.   Si su medicamento es muy caro, por favor, pngase en contacto con nuestra oficina llamando al [phone] y presione la opcin 4 o envenos un mensaje a travs de MyChart.   No podemos decirle cul ser su copago por los medicamentos por adelantado ya que esto es diferente dependiendo de la cobertura de su seguro. Sin embargo, es posible que podamos encontrar un medicamento sustituto a menor costo o llenar un formulario para que el  seguro cubra el medicamento que se considera necesario.   Si se requiere una autorizacin previa para que su compaa de seguros cubra su medicamento, por favor permtanos de 1 a 2 das hbiles para completar este proceso.  Los precios de los medicamentos varan con frecuencia dependiendo del lugar de dnde se surte la receta y alguna farmacias pueden ofrecer precios ms baratos.  El sitio web www.goodrx.com tiene cupones para medicamentos de diferentes farmacias. Los precios aqu no tienen en cuenta lo que podra costar con la ayuda del seguro (puede ser ms barato con su seguro), pero el sitio web puede darle el precio si no utiliz ningn seguro.  - Puede imprimir el cupn correspondiente y llevarlo con su receta a la farmacia.  - Tambin puede pasar por nuestra oficina durante el horario de atencin regular y recoger una tarjeta de cupones de GoodRx.  - Si necesita que su receta se enve electrnicamente a una farmacia diferente, informe a nuestra oficina a travs de MyChart de [place]   o por telfono llamando al [phone] y presione la opcin 4.  

## 2022-01-03 ENCOUNTER — Encounter: Payer: Self-pay | Admitting: Dermatology

## 2022-02-19 ENCOUNTER — Telehealth: Payer: Self-pay | Admitting: Internal Medicine

## 2022-02-19 DIAGNOSIS — E785 Hyperlipidemia, unspecified: Secondary | ICD-10-CM

## 2022-02-19 DIAGNOSIS — I10 Essential (primary) hypertension: Secondary | ICD-10-CM

## 2022-02-19 MED ORDER — AMLODIPINE BESYLATE 5 MG PO TABS: 5.0000 mg | ORAL_TABLET | Freq: Every day | ORAL | 0 refills | Status: DC

## 2022-02-19 MED ORDER — ATORVASTATIN CALCIUM 10 MG PO TABS: 10.0000 mg | ORAL_TABLET | Freq: Every day | ORAL | 0 refills | Status: DC

## 2022-02-19 MED ORDER — LOSARTAN POTASSIUM-HCTZ 100-12.5 MG PO TABS: 1.0000 | ORAL_TABLET | Freq: Every day | ORAL | 0 refills | Status: DC

## 2022-02-19 NOTE — Telephone Encounter (Signed)
Refills sent 26 pills enough to cover until 1/30 [facility] appt with Tomasita Morrow NP

## 2022-02-19 NOTE — Telephone Encounter (Signed)
Pt need refill on Losartan, atorvastatin and amLODipine sent to [place]

## 2022-02-28 ENCOUNTER — Other Ambulatory Visit: Payer: Self-pay | Admitting: Family

## 2022-02-28 DIAGNOSIS — I10 Essential (primary) hypertension: Secondary | ICD-10-CM

## 2022-03-16 DIAGNOSIS — E785 Hyperlipidemia, unspecified: Secondary | ICD-10-CM | POA: Insufficient documentation

## 2022-03-16 NOTE — Progress Notes (Unsigned)
Tomasita Morrow, NP-C Phone: [phone]  Nathan Fitzgerald is a 47 y.o. male who presents today for transfer of care. He has a history of insomnia. He has tried medications in the past that have not helped. He is able to fall asleep but not able to stay asleep. He currently gets approximately 5-6 hours of sleep each night. He has never had a sleep study.   HYPERTENSION Disease Monitoring Home BP Monitoring- Not checking Chest pain- No    Dyspnea- No Medications Compliance-  Norvasc and Losartan/HCTZ. Lightheadedness-  No  Edema- No BMET    Component Value Date/Time   NA 137 03/13/2021 0935   K 4.1 03/13/2021 0935   CL 100 03/13/2021 0935   CO2 27 03/13/2021 0935   GLUCOSE 88 03/13/2021 0935   BUN 21 03/13/2021 0935   CREATININE 1.01 03/13/2021 0935   CALCIUM 9.5 03/13/2021 0935   GFRNONAA >60 07/13/2015 1650   GFRAA >60 07/13/2015 1650   HYPERLIPIDEMIA Symptoms Chest pain on exertion:  No   Leg claudication:   No Medications: Compliance- Lipitor  Right upper quadrant pain- No  Muscle aches- No Lipid Panel     Component Value Date/Time   CHOL 230 (H) 03/13/2021 0935   TRIG 122.0 03/13/2021 0935   HDL 53.80 03/13/2021 0935   CHOLHDL 4 03/13/2021 0935   VLDL 24.4 03/13/2021 0935   LDLCALC 152 (H) 03/13/2021 0935   LDLDIRECT 148.0 02/23/2020 0816   Anxiety/Depression- No longer on any medications for his anxiety and depression. He is not seeing a counselor or therapist. He reports doing well. He quit his job which is what he believes was causing a lot of his depression. He is currently self-employed. He denies SI/HI.   Social History   Tobacco Use  Smoking Status Former  Smokeless Tobacco Never    Current Outpatient Medications on File Prior to Visit  Medication Sig Dispense Refill   Multiple Vitamins-Minerals (CENTRUM SILVER 50+MEN PO) Take by mouth daily at 12 noon.     No current facility-administered medications on file prior to visit.     ROS see history of  present illness  Objective  Physical Exam Vitals:   03/17/22 1002  BP: 134/80  Pulse: 74  Temp: 98.2 F (36.8 C)  SpO2: 99%    BP Readings from Last 3 Encounters:  03/17/22 134/80  06/04/21 133/85  06/02/21 120/69   Wt Readings from Last 3 Encounters:  03/17/22 207 lb (93.9 kg)  06/04/21 197 lb (89.4 kg)  06/02/21 200 lb (90.7 kg)    Physical Exam Constitutional:      General: He is not in acute distress.    Appearance: Normal appearance.  HENT:     Head: Normocephalic.  Cardiovascular:     Rate and Rhythm: Normal rate and regular rhythm.     Heart sounds: Normal heart sounds.  Pulmonary:     Effort: Pulmonary effort is normal.     Breath sounds: Normal breath sounds.  Abdominal:     General: Abdomen is flat. Bowel sounds are normal.     Palpations: Abdomen is soft. There is no mass.     Tenderness: There is no abdominal tenderness.  Skin:    General: Skin is warm and dry.  Neurological:     General: No focal deficit present.     Mental Status: He is alert.  Psychiatric:        Mood and Affect: Mood normal.        Behavior: Behavior  normal.     Assessment/Plan: Please see individual problem list.  Primary hypertension Assessment & Plan: Chronic. Stable on Norvasc and Losartan/HCTZ. Continue. Lab work as outlined.   Orders: -     amLODIPine Besylate; Take 1 tablet (5 mg total) by mouth daily.  Dispense: 90 tablet; Refill: 3 -     Losartan Potassium-HCTZ; Take 1 tablet by mouth daily.  Dispense: 90 tablet; Refill: 3 -     CBC with Differential/Platelet -     Comprehensive metabolic panel  Hyperlipidemia, unspecified hyperlipidemia type Assessment & Plan: Chronic. Stable on Lipitor daily. Continue. Will check lipids today.   Orders: -     Atorvastatin Calcium; Take 1 tablet (10 mg total) by mouth daily. After 6 pm  Dispense: 90 tablet; Refill: 3 -     Lipid panel  Adjustment disorder with mixed anxiety and depressed mood Assessment &  Plan: Currently doing well. Not on any medications. Will continue to monitor. Encouraged patient to contact if symptoms return.    Insomnia, unspecified type Assessment & Plan: Long history of difficulty staying asleep. Has tried multiple medications and OTC medications without relief. Will refer to [facility] for further evaluation.  Orders: -     Ambulatory referral to Sleep Studies  Overweight (BMI 25.0-29.9) Assessment & Plan: Will check A1c today. Encouraged healthy diet and exercise.   Orders: -     Hemoglobin A1c  Low testosterone in male Assessment & Plan: Has seen Urology in the past with no treatment. Denies any symptoms. Will check testosterone level today.   Orders: -     Testosterone  Thyroid disorder screen -     TSH   Return in about 1 year (around 03/18/2023).   Tomasita Morrow, NP-C [place]

## 2022-03-17 ENCOUNTER — Encounter: Payer: Self-pay | Admitting: Nurse Practitioner

## 2022-03-17 ENCOUNTER — Ambulatory Visit: Payer: 59 | Admitting: Nurse Practitioner

## 2022-03-17 VITALS — BP 134/80 | HR 74 | Temp 98.2°F | Ht 72.0 in | Wt 207.0 lb

## 2022-03-17 DIAGNOSIS — R7989 Other specified abnormal findings of blood chemistry: Secondary | ICD-10-CM | POA: Diagnosis not present

## 2022-03-17 DIAGNOSIS — I10 Essential (primary) hypertension: Secondary | ICD-10-CM | POA: Diagnosis not present

## 2022-03-17 DIAGNOSIS — Z1329 Encounter for screening for other suspected endocrine disorder: Secondary | ICD-10-CM

## 2022-03-17 DIAGNOSIS — F4323 Adjustment disorder with mixed anxiety and depressed mood: Secondary | ICD-10-CM | POA: Diagnosis not present

## 2022-03-17 DIAGNOSIS — G4700 Insomnia, unspecified: Secondary | ICD-10-CM

## 2022-03-17 DIAGNOSIS — E785 Hyperlipidemia, unspecified: Secondary | ICD-10-CM

## 2022-03-17 DIAGNOSIS — E663 Overweight: Secondary | ICD-10-CM | POA: Diagnosis not present

## 2022-03-17 LAB — CBC WITH DIFFERENTIAL/PLATELET
Basophils Absolute: 0 10*3/uL (ref 0.0–0.1)
Basophils Relative: 1 % (ref 0.0–3.0)
Eosinophils Absolute: 0.1 10*3/uL (ref 0.0–0.7)
Eosinophils Relative: 1.5 % (ref 0.0–5.0)
HCT: 41.2 % (ref 39.0–52.0)
Hemoglobin: 14.5 g/dL (ref 13.0–17.0)
Lymphocytes Relative: 32 % (ref 12.0–46.0)
Lymphs Abs: 1.5 10*3/uL (ref 0.7–4.0)
MCHC: 35.2 g/dL (ref 30.0–36.0)
MCV: 88.6 fl (ref 78.0–100.0)
Monocytes Absolute: 0.4 10*3/uL (ref 0.1–1.0)
Monocytes Relative: 8.4 % (ref 3.0–12.0)
Neutro Abs: 2.7 10*3/uL (ref 1.4–7.7)
Neutrophils Relative %: 57.1 % (ref 43.0–77.0)
Platelets: 235 10*3/uL (ref 150.0–400.0)
RBC: 4.65 Mil/uL (ref 4.22–5.81)
RDW: 13.5 % (ref 11.5–15.5)
WBC: 4.7 10*3/uL (ref 4.0–10.5)

## 2022-03-17 LAB — COMPREHENSIVE METABOLIC PANEL
ALT: 25 U/L (ref 0–53)
AST: 21 U/L (ref 0–37)
Albumin: 4.9 g/dL (ref 3.5–5.2)
Alkaline Phosphatase: 78 U/L (ref 39–117)
BUN: 20 mg/dL (ref 6–23)
CO2: 26 mEq/L (ref 19–32)
Calcium: 9.8 mg/dL (ref 8.4–10.5)
Chloride: 99 mEq/L (ref 96–112)
Creatinine, Ser: 0.82 mg/dL (ref 0.40–1.50)
GFR: 104.98 mL/min (ref >60.00)
Glucose, Bld: 93 mg/dL (ref 70–99)
Potassium: 3.8 mEq/L (ref 3.5–5.1)
Sodium: 137 mEq/L (ref 135–145)
Total Bilirubin: 0.8 mg/dL (ref 0.2–1.2)
Total Protein: 7.7 g/dL (ref 6.0–8.3)

## 2022-03-17 LAB — LIPID PANEL
Cholesterol: 180 mg/dL (ref 0–200)
HDL: 70.2 mg/dL (ref >39.00)
LDL Cholesterol: 86 mg/dL (ref 0–99)
NonHDL: 110.02
Total CHOL/HDL Ratio: 3
Triglycerides: 120 mg/dL (ref 0.0–149.0)
VLDL: 24 mg/dL (ref 0.0–40.0)

## 2022-03-17 LAB — HEMOGLOBIN A1C: Hgb A1c MFr Bld: 5.2 % (ref 4.6–6.5)

## 2022-03-17 LAB — TESTOSTERONE: Testosterone: 234.39 ng/dL — ABNORMAL LOW (ref 300.00–890.00)

## 2022-03-17 LAB — TSH: TSH: 1.36 u[IU]/mL (ref 0.35–5.50)

## 2022-03-17 MED ORDER — LOSARTAN POTASSIUM-HCTZ 100-12.5 MG PO TABS: 1.0000 | ORAL_TABLET | Freq: Every day | ORAL | 3 refills | Status: DC

## 2022-03-17 MED ORDER — ATORVASTATIN CALCIUM 10 MG PO TABS: 10.0000 mg | ORAL_TABLET | Freq: Every day | ORAL | 3 refills | Status: DC

## 2022-03-17 MED ORDER — AMLODIPINE BESYLATE 5 MG PO TABS: 5.0000 mg | ORAL_TABLET | Freq: Every day | ORAL | 3 refills | Status: DC

## 2022-03-17 NOTE — Assessment & Plan Note (Signed)
Chronic. Stable on Lipitor daily. Continue. Will check lipids today.

## 2022-03-17 NOTE — Assessment & Plan Note (Signed)
Currently doing well. Not on any medications. Will continue to monitor. Encouraged patient to contact if symptoms return.

## 2022-03-17 NOTE — Assessment & Plan Note (Signed)
Has seen Urology in the past with no treatment. Denies any symptoms. Will check testosterone level today.

## 2022-03-17 NOTE — Assessment & Plan Note (Signed)
Chronic. Stable on Norvasc and Losartan/HCTZ. Continue. Lab work as outlined.

## 2022-03-17 NOTE — Assessment & Plan Note (Signed)
Long history of difficulty staying asleep. Has tried multiple medications and OTC medications without relief. Will refer to [facility] for further evaluation.

## 2022-03-17 NOTE — Assessment & Plan Note (Signed)
Will check A1c today. Encouraged healthy diet and exercise.  

## 2022-03-20 ENCOUNTER — Other Ambulatory Visit: Payer: Self-pay

## 2022-03-20 ENCOUNTER — Telehealth: Payer: Self-pay

## 2022-03-20 DIAGNOSIS — G4700 Insomnia, unspecified: Secondary | ICD-10-CM

## 2022-03-20 NOTE — Telephone Encounter (Signed)
Sent to [facility]

## 2022-03-20 NOTE — Telephone Encounter (Signed)
    Referral to [facility] cancelled - out of network

## 2022-04-05 ENCOUNTER — Encounter: Payer: Self-pay | Admitting: Nurse Practitioner

## 2022-04-08 DIAGNOSIS — Z2981 Encounter for HIV pre-exposure prophylaxis: Secondary | ICD-10-CM | POA: Insufficient documentation

## 2022-04-08 DIAGNOSIS — Z7189 Other specified counseling: Secondary | ICD-10-CM | POA: Insufficient documentation

## 2022-04-08 NOTE — Progress Notes (Unsigned)
Tomasita Morrow, NP-C Phone: [phone]  Nathan Fitzgerald is a 47 y.o. male who presents today to discuss medication.   Patient is interested in starting on PrEP, pre-exposure prophylaxis to prevent HIV, medication. He denies any recent exposure to HIV. He does not have a partner that is HIV positive. He is married to a male however, they do have sexual relations with other people. He denies any IV drug use. He has not had recent HIV testing.   He also reports a rash that started on Tuesday on his right wrist. Reports it is red and very itchy. It was painful yesterday. Reports it has occurred in the past then resolved on its own. He has seen Dermatology in the past who believes it was caused by something environmental. Denies any recent changes in soaps, lotions or detergents. He does do a lot outside.   Social History   Tobacco Use  Smoking Status Former  Smokeless Tobacco Never    Current Outpatient Medications on File Prior to Visit  Medication Sig Dispense Refill   amLODipine (NORVASC) 5 MG tablet Take 1 tablet (5 mg total) by mouth daily. 90 tablet 3   atorvastatin (LIPITOR) 10 MG tablet Take 1 tablet (10 mg total) by mouth daily. After 6 pm 90 tablet 3   losartan-hydrochlorothiazide (HYZAAR) 100-12.5 MG tablet Take 1 tablet by mouth daily. 90 tablet 3   Multiple Vitamins-Minerals (CENTRUM SILVER 50+MEN PO) Take by mouth daily at 12 noon.     No current facility-administered medications on file prior to visit.     ROS see history of present illness  Objective  Physical Exam Vitals:   04/09/22 1538 04/09/22 1552  BP: (!) 144/82 138/88  Pulse: 84   Temp: 98.8 F (37.1 C)   SpO2: 99%     BP Readings from Last 3 Encounters:  04/09/22 138/88  03/17/22 134/80  06/04/21 133/85   Wt Readings from Last 3 Encounters:  04/09/22 197 lb 9.6 oz (89.6 kg)  03/17/22 207 lb (93.9 kg)  06/04/21 197 lb (89.4 kg)    Physical Exam Constitutional:      General: He is not in acute  distress.    Appearance: Normal appearance.  HENT:     Head: Normocephalic.  Cardiovascular:     Rate and Rhythm: Normal rate and regular rhythm.     Heart sounds: Normal heart sounds.  Pulmonary:     Effort: Pulmonary effort is normal.     Breath sounds: Normal breath sounds.  Skin:    General: Skin is warm and dry.     Findings: Rash present. Rash is pustular.          Comments: Red, linear pustules noted on right wrist going up forearm.   Neurological:     General: No focal deficit present.     Mental Status: He is alert.  Psychiatric:        Mood and Affect: Mood normal.        Behavior: Behavior normal.    Assessment/Plan: Please see individual problem list.  Encounter for HIV screening and discussion of pre-exposure prophylaxis for HIV Assessment & Plan: HIV testing today. Counseled on benefits and side effects of PrEP medication. If HIV testing negative will start on oral medication. Discussed with patient continuing safe sex practices, this medication does not reduce the risk of getting a sexually transmitted infection and the need for close monitoring of lab work such as liver function. Discussed importance of taking daily. He will  follow up every 90 days for lab work and to assess HIV status. He will also contact if adverse side effects occur or he develops any STI symptoms.   Orders: -     HIV Antibody (routine testing w rflx)  Contact dermatitis, unspecified contact dermatitis type, unspecified trigger Assessment & Plan: Likely from environmental contact. Unsure of trigger. Will treat with Prednisone taper. Advised to contact if not improving or follow up with Dermatology.   Orders: -     predniSONE; TAKE 3 TABLETS PO QD FOR 3 DAYS THEN TAKE 2 TABLETS PO QD FOR 3 DAYS THEN TAKE 1 TABLET PO QD FOR 3 DAYS THEN TAKE 1/2 TAB PO QD FOR 3 DAYS  Dispense: 20 tablet; Refill: 0   Return if symptoms worsen or fail to improve.   Tomasita Morrow, NP-C [place]

## 2022-04-09 ENCOUNTER — Ambulatory Visit: Payer: 59 | Admitting: Nurse Practitioner

## 2022-04-09 ENCOUNTER — Encounter: Payer: Self-pay | Admitting: Nurse Practitioner

## 2022-04-09 ENCOUNTER — Telehealth: Payer: Self-pay | Admitting: Internal Medicine

## 2022-04-09 VITALS — BP 138/88 | HR 84 | Temp 98.8°F | Ht 72.0 in | Wt 197.6 lb

## 2022-04-09 DIAGNOSIS — L259 Unspecified contact dermatitis, unspecified cause: Secondary | ICD-10-CM | POA: Diagnosis not present

## 2022-04-09 DIAGNOSIS — R69 Illness, unspecified: Secondary | ICD-10-CM | POA: Diagnosis not present

## 2022-04-09 DIAGNOSIS — Z114 Encounter for screening for human immunodeficiency virus [HIV]: Secondary | ICD-10-CM

## 2022-04-09 DIAGNOSIS — Z7189 Other specified counseling: Secondary | ICD-10-CM | POA: Diagnosis not present

## 2022-04-09 MED ORDER — PREDNISONE 10 MG PO TABS: ORAL_TABLET | ORAL | 0 refills | Status: DC

## 2022-04-09 NOTE — Telephone Encounter (Signed)
Hi Kacy, On call - "Prednisone Rx is not at the pharmacy ([facility] is down)."  I called it in. Thanks, AP

## 2022-04-09 NOTE — Assessment & Plan Note (Signed)
HIV testing today. Counseled on benefits and side effects of PrEP medication. If HIV testing negative will start on oral medication. Discussed with patient continuing safe sex practices, this medication does not reduce the risk of getting a sexually transmitted infection and the need for close monitoring of lab work such as liver function. Discussed importance of taking daily. He will follow up every 90 days for lab work and to assess HIV status. He will also contact if adverse side effects occur or he develops any STI symptoms.

## 2022-04-09 NOTE — Assessment & Plan Note (Signed)
Likely from environmental contact. Unsure of trigger. Will treat with Prednisone taper. Advised to contact if not improving or follow up with Dermatology.

## 2022-04-10 ENCOUNTER — Other Ambulatory Visit: Payer: Self-pay | Admitting: Nurse Practitioner

## 2022-04-10 DIAGNOSIS — Z7189 Other specified counseling: Secondary | ICD-10-CM

## 2022-04-10 LAB — HIV ANTIBODY (ROUTINE TESTING W REFLEX): HIV 1&2 Ab, 4th Generation: NONREACTIVE

## 2022-04-10 MED ORDER — DESCOVY 200-25 MG PO TABS: 1.0000 | ORAL_TABLET | Freq: Every day | ORAL | 11 refills | Status: DC

## 2022-04-10 NOTE — Assessment & Plan Note (Signed)
HIV testing negative. Descovy 200-25 mg daily sent to pharmacy. Patient will return in 90 days for labs and HIV status testing. He will stop the medication if any adverse side effects occur. Will monitor.

## 2022-04-10 NOTE — Progress Notes (Signed)
HIV testing negative. Descovy Rx sent to pharmacy. Will monitor.

## 2022-04-11 ENCOUNTER — Encounter: Payer: Self-pay | Admitting: Nurse Practitioner

## 2022-04-14 ENCOUNTER — Encounter: Payer: Self-pay | Admitting: Nurse Practitioner

## 2022-04-15 ENCOUNTER — Encounter: Payer: Self-pay | Admitting: Nurse Practitioner

## 2022-06-25 ENCOUNTER — Ambulatory Visit: Payer: 59 | Admitting: Dermatology

## 2022-08-06 ENCOUNTER — Ambulatory Visit: Payer: 59 | Admitting: Dermatology

## 2022-09-22 ENCOUNTER — Other Ambulatory Visit (HOSPITAL_COMMUNITY): Payer: Self-pay

## 2023-01-25 ENCOUNTER — Telehealth: Payer: 59 | Admitting: Physician Assistant

## 2023-01-25 DIAGNOSIS — B9689 Other specified bacterial agents as the cause of diseases classified elsewhere: Secondary | ICD-10-CM | POA: Diagnosis not present

## 2023-01-25 DIAGNOSIS — J019 Acute sinusitis, unspecified: Secondary | ICD-10-CM | POA: Diagnosis not present

## 2023-01-25 MED ORDER — AMOXICILLIN-POT CLAVULANATE 875-125 MG PO TABS: 1.0000 | ORAL_TABLET | Freq: Two times a day (BID) | ORAL | 0 refills | Status: DC

## 2023-01-25 NOTE — Progress Notes (Signed)
Virtual Visit Consent   [name], you are scheduled for a virtual visit with a [facility] provider today. Just as with appointments in the office, your consent must be obtained to participate. Your consent will be active for this visit and any virtual visit you may have with one of our providers in the next 365 days. If you have a MyChart account, a copy of this consent can be sent to you electronically.  As this is a virtual visit, video technology does not allow for your provider to perform a traditional examination. This may limit your provider's ability to fully assess your condition. If your provider identifies any concerns that need to be evaluated in person or the need to arrange testing (such as labs, EKG, etc.), we will make arrangements to do so. Although advances in technology are sophisticated, we cannot ensure that it will always work on either your end or our end. If the connection with a video visit is poor, the visit may have to be switched to a telephone visit. With either a video or telephone visit, we are not always able to ensure that we have a secure connection.  By engaging in this virtual visit, you consent to the provision of healthcare and authorize for your insurance to be billed (if applicable) for the services provided during this visit. Depending on your insurance coverage, you may receive a charge related to this service.  I need to obtain your verbal consent now. Are you willing to proceed with your visit today? Nathan Fitzgerald has provided verbal consent on 01/25/2023 for a virtual visit (video or telephone). Margaretann Loveless, PA-C  Date: 01/25/2023 12:29 PM  Virtual Visit via Video Note   I, Margaretann Loveless, connected with  Nathan Fitzgerald  ([id number], Nov 14, 1975) on 01/25/23 at 12:30 PM EST by a video-enabled telemedicine application and verified that I am speaking with the correct person using two identifiers.  Location: Patient: Virtual Visit Location  Patient: Home Provider: Virtual Visit Location Provider: Home Office   I discussed the limitations of evaluation and management by telemedicine and the availability of in person appointments. The patient expressed understanding and agreed to proceed.    History of Present Illness: Nathan Fitzgerald is a 47 y.o. who identifies as a male who was assigned male at birth, and is being seen today for sinus congestion.  HPI: Sinusitis This is a new problem. The current episode started 1 to 4 weeks ago (10 days). The problem has been gradually worsening since onset. Maximum temperature: subjective low grade. The fever has been present for 1 to 2 days. Associated symptoms include congestion, coughing (throat clearing), headaches, sinus pressure, a sore throat (initially, improved and then returned) and swollen glands (left). Pertinent negatives include no chills, ear pain or hoarse voice. (Post nasal drainage) Treatments tried: dayquil, nyquil, ibuprofen. The treatment provided no relief.     Problems:  Patient Active Problem List   Diagnosis Date Noted   Contact dermatitis 04/09/2022   Encounter for HIV screening and discussion of pre-exposure prophylaxis for HIV 04/08/2022   Hyperlipidemia 03/16/2022   COVID-19 05/19/2021   Low testosterone in male 05/15/2021   Adjustment disorder with mixed anxiety and depressed mood 02/20/2021   Insomnia 02/20/2021   Alcohol abuse 02/20/2021   Inflammatory pain 01/01/2021   Lumbar radiculopathy 01/01/2021   Annual physical exam 03/12/2020   Overweight (BMI 25.0-29.9) 03/12/2020   Murmur 02/25/2020   Former smoker 02/25/2020   Lumbar herniated  disc 02/21/2020   Hypertension 02/21/2020   Abnormal MRI, lumbar spine 02/21/2020   Low back pain 07/01/2016    Allergies: No Known Allergies Medications:  Current Outpatient Medications:    amoxicillin-clavulanate (AUGMENTIN) 875-125 MG tablet, Take 1 tablet by mouth 2 (two) times daily., Disp: 14 tablet, Rfl: 0    amLODipine (NORVASC) 5 MG tablet, Take 1 tablet (5 mg total) by mouth daily., Disp: 90 tablet, Rfl: 3   atorvastatin (LIPITOR) 10 MG tablet, Take 1 tablet (10 mg total) by mouth daily. After 6 pm, Disp: 90 tablet, Rfl: 3   emtricitabine-tenofovir AF (DESCOVY) 200-25 MG tablet, Take 1 tablet by mouth daily., Disp: 30 tablet, Rfl: 11   losartan-hydrochlorothiazide (HYZAAR) 100-12.5 MG tablet, Take 1 tablet by mouth daily., Disp: 90 tablet, Rfl: 3   Multiple Vitamins-Minerals (CENTRUM SILVER 50+MEN PO), Take by mouth daily at 12 noon., Disp: , Rfl:    predniSONE (DELTASONE) 10 MG tablet, TAKE 3 TABLETS PO QD FOR 3 DAYS THEN TAKE 2 TABLETS PO QD FOR 3 DAYS THEN TAKE 1 TABLET PO QD FOR 3 DAYS THEN TAKE 1/2 TAB PO QD FOR 3 DAYS, Disp: 20 tablet, Rfl: 0  Observations/Objective: Patient is well-developed, well-nourished in no acute distress.  Resting comfortably at home.  Head is normocephalic, atraumatic.  No labored breathing.  Speech is clear and coherent with logical content.  Patient is alert and oriented at baseline.    Assessment and Plan: 1. Acute bacterial sinusitis - amoxicillin-clavulanate (AUGMENTIN) 875-125 MG tablet; Take 1 tablet by mouth 2 (two) times daily.  Dispense: 14 tablet; Refill: 0  - Worsening symptoms that have not responded to OTC medications.  - Will give Augmentin - Continue allergy medications.  - Steam and humidifier can help - Stay well hydrated and get plenty of rest.  - Seek in person evaluation if no symptom improvement or if symptoms worsen   Follow Up Instructions: I discussed the assessment and treatment plan with the patient. The patient was provided an opportunity to ask questions and all were answered. The patient agreed with the plan and demonstrated an understanding of the instructions.  A copy of instructions were sent to the patient via MyChart unless otherwise noted below.    The patient was advised to call back or seek an in-person evaluation if  the symptoms worsen or if the condition fails to improve as anticipated.    Margaretann Loveless, PA-C

## 2023-01-25 NOTE — Patient Instructions (Signed)
Nathan Fitzgerald, thank you for joining Margaretann Loveless, PA-C for today's virtual visit.  While this provider is not your primary care provider (PCP), if your PCP is located in our provider database this encounter information will be shared with them immediately following your visit.   A [facility] MyChart account gives you access to today's visit and all your visits, tests, and labs performed at [facility] " click here if you don't have a [facility] MyChart account or go to [email or web address]  Consent: (Patient) Nathan Fitzgerald provided verbal consent for this virtual visit at the beginning of the encounter.  Current Medications:  Current Outpatient Medications:    amoxicillin-clavulanate (AUGMENTIN) 875-125 MG tablet, Take 1 tablet by mouth 2 (two) times daily., Disp: 14 tablet, Rfl: 0   amLODipine (NORVASC) 5 MG tablet, Take 1 tablet (5 mg total) by mouth daily., Disp: 90 tablet, Rfl: 3   atorvastatin (LIPITOR) 10 MG tablet, Take 1 tablet (10 mg total) by mouth daily. After 6 pm, Disp: 90 tablet, Rfl: 3   emtricitabine-tenofovir AF (DESCOVY) 200-25 MG tablet, Take 1 tablet by mouth daily., Disp: 30 tablet, Rfl: 11   losartan-hydrochlorothiazide (HYZAAR) 100-12.5 MG tablet, Take 1 tablet by mouth daily., Disp: 90 tablet, Rfl: 3   Multiple Vitamins-Minerals (CENTRUM SILVER 50+MEN PO), Take by mouth daily at 12 noon., Disp: , Rfl:    predniSONE (DELTASONE) 10 MG tablet, TAKE 3 TABLETS PO QD FOR 3 DAYS THEN TAKE 2 TABLETS PO QD FOR 3 DAYS THEN TAKE 1 TABLET PO QD FOR 3 DAYS THEN TAKE 1/2 TAB PO QD FOR 3 DAYS, Disp: 20 tablet, Rfl: 0   Medications ordered in this encounter:  Meds ordered this encounter  Medications   amoxicillin-clavulanate (AUGMENTIN) 875-125 MG tablet    Sig: Take 1 tablet by mouth 2 (two) times daily.    Dispense:  14 tablet    Refill:  0    Order Specific Question:   Supervising Provider    Answer:   Merrilee Jansky [id number]     *If you need  refills on other medications prior to your next appointment, please contact your pharmacy*  Follow-Up: Call back or seek an in-person evaluation if the symptoms worsen or if the condition fails to improve as anticipated.  [facility] [phone]  Other Instructions Sinus Infection, Adult A sinus infection, also called sinusitis, is inflammation of your sinuses. Sinuses are hollow spaces in the bones around your face. Your sinuses are located: Around your eyes. In the middle of your forehead. Behind your nose. In your cheekbones. Mucus normally drains out of your sinuses. When your nasal tissues become inflamed or swollen, mucus can become trapped or blocked. This allows bacteria, viruses, and fungi to grow, which leads to infection. Most infections of the sinuses are caused by a virus. A sinus infection can develop quickly. It can last for up to 4 weeks (acute) or for more than 12 weeks (chronic). A sinus infection often develops after a cold. What are the causes? This condition is caused by anything that creates swelling in the sinuses or stops mucus from draining. This includes: Allergies. Asthma. Infection from bacteria or viruses. Deformities or blockages in your nose or sinuses. Abnormal growths in the nose (nasal polyps). Pollutants, such as chemicals or irritants in the air. Infection from fungi. This is rare. What increases the risk? You are more likely to develop this condition if you: Have a weak body defense system (immune  system). Do a lot of swimming or diving. Overuse nasal sprays. Smoke. What are the signs or symptoms? The main symptoms of this condition are pain and a feeling of pressure around the affected sinuses. Other symptoms include: Stuffy nose or congestion that makes it difficult to breathe through your nose. Thick yellow or greenish drainage from your nose. Tenderness, swelling, and warmth over the affected sinuses. A cough that may get  worse at night. Decreased sense of smell and taste. Extra mucus that collects in the throat or the back of the nose (postnasal drip) causing a sore throat or bad breath. Tiredness (fatigue). Fever. How is this diagnosed? This condition is diagnosed based on: Your symptoms. Your medical history. A physical exam. Tests to find out if your condition is acute or chronic. This may include: Checking your nose for nasal polyps. Viewing your sinuses using a device that has a light (endoscope). Testing for allergies or bacteria. Imaging tests, such as an MRI or CT scan. In rare cases, a bone biopsy may be done to rule out more serious types of fungal sinus disease. How is this treated? Treatment for a sinus infection depends on the cause and whether your condition is chronic or acute. If caused by a virus, your symptoms should go away on their own within 10 days. You may be given medicines to relieve symptoms. They include: Medicines that shrink swollen nasal passages (decongestants). A spray that eases inflammation of the nostrils (topical intranasal corticosteroids). Rinses that help get rid of thick mucus in your nose (nasal saline washes). Medicines that treat allergies (antihistamines). Over-the-counter pain relievers. If caused by bacteria, your health care provider may recommend waiting to see if your symptoms improve. Most bacterial infections will get better without antibiotic medicine. You may be given antibiotics if you have: A severe infection. A weak immune system. If caused by narrow nasal passages or nasal polyps, surgery may be needed. Follow these instructions at home: Medicines Take, use, or apply over-the-counter and prescription medicines only as told by your health care provider. These may include nasal sprays. If you were prescribed an antibiotic medicine, take it as told by your health care provider. Do not stop taking the antibiotic even if you start to feel  better. Hydrate and humidify  Drink enough fluid to keep your urine pale yellow. Staying hydrated will help to thin your mucus. Use a cool mist humidifier to keep the humidity level in your home above 50%. Inhale steam for 10-15 minutes, 3-4 times a day, or as told by your health care provider. You can do this in the bathroom while a hot shower is running. Limit your exposure to cool or dry air. Rest Rest as much as possible. Sleep with your head raised (elevated). Make sure you get enough sleep each night. General instructions  Apply a warm, moist washcloth to your face 3-4 times a day or as told by your health care provider. This will help with discomfort. Use nasal saline washes as often as told by your health care provider. Wash your hands often with soap and water to reduce your exposure to germs. If soap and water are not available, use hand sanitizer. Do not smoke. Avoid being around people who are smoking (secondhand smoke). Keep all follow-up visits. This is important. Contact a health care provider if: You have a fever. Your symptoms get worse. Your symptoms do not improve within 10 days. Get help right away if: You have a severe headache. You have persistent  vomiting. You have severe pain or swelling around your face or eyes. You have vision problems. You develop confusion. Your neck is stiff. You have trouble breathing. These symptoms may be an emergency. Get help right away. Call 911. Do not wait to see if the symptoms will go away. Do not drive yourself to the hospital. Summary A sinus infection is soreness and inflammation of your sinuses. Sinuses are hollow spaces in the bones around your face. This condition is caused by nasal tissues that become inflamed or swollen. The swelling traps or blocks the flow of mucus. This allows bacteria, viruses, and fungi to grow, which leads to infection. If you were prescribed an antibiotic medicine, take it as told by your  health care provider. Do not stop taking the antibiotic even if you start to feel better. Keep all follow-up visits. This is important. This information is not intended to replace advice given to you by your health care provider. Make sure you discuss any questions you have with your health care provider. Document Revised: 01/07/2021 Document Reviewed: 01/07/2021 Elsevier Patient Education  2024 Elsevier Inc.    If you have been instructed to have an in-person evaluation today at a local Urgent Care facility, please use the link below. It will take you to a list of all of our available [facility], including address, phone number and hours of operation. Please do not delay care.  [facility]  If you or a family member do not have a primary care provider, use the link below to schedule a visit and establish care. When you choose a [facility] primary care physician or advanced practice provider, you gain a long-term partner in health. Find a Primary Care Provider  Learn more about [facility] in-office and virtual care options: [facility] Get Care Now

## 2023-02-11 DIAGNOSIS — Z202 Contact with and (suspected) exposure to infections with a predominantly sexual mode of transmission: Secondary | ICD-10-CM | POA: Diagnosis not present

## 2023-02-11 DIAGNOSIS — R0981 Nasal congestion: Secondary | ICD-10-CM | POA: Diagnosis not present

## 2023-02-22 ENCOUNTER — Ambulatory Visit (INDEPENDENT_AMBULATORY_CARE_PROVIDER_SITE_OTHER): Payer: 59

## 2023-02-22 ENCOUNTER — Ambulatory Visit (HOSPITAL_COMMUNITY)
Admission: RE | Admit: 2023-02-22 | Discharge: 2023-02-22 | Disposition: A | Discharge status: Home / Self Care | Payer: 59 | Source: Ambulatory Visit | Attending: Emergency Medicine | Admitting: Emergency Medicine

## 2023-02-22 ENCOUNTER — Encounter (HOSPITAL_COMMUNITY): Payer: Self-pay

## 2023-02-22 VITALS — BP 165/98 | HR 72 | Temp 98.1°F | Resp 18

## 2023-02-22 DIAGNOSIS — J069 Acute upper respiratory infection, unspecified: Secondary | ICD-10-CM

## 2023-02-22 DIAGNOSIS — R071 Chest pain on breathing: Secondary | ICD-10-CM | POA: Diagnosis not present

## 2023-02-22 DIAGNOSIS — R0781 Pleurodynia: Secondary | ICD-10-CM

## 2023-02-22 DIAGNOSIS — J309 Allergic rhinitis, unspecified: Secondary | ICD-10-CM

## 2023-02-22 MED ORDER — LEVOCETIRIZINE DIHYDROCHLORIDE 5 MG PO TABS: 5.0000 mg | ORAL_TABLET | Freq: Every evening | ORAL | 0 refills | Status: DC

## 2023-02-22 MED ORDER — MOMETASONE FUROATE 50 MCG/ACT NA SUSP: 2.0000 | Freq: Every day | NASAL | 2 refills | Status: DC

## 2023-02-22 NOTE — Discharge Instructions (Signed)
 The x-ray of your chest was not concerning for pneumonia.  I believe that the pain that you are experiencing on the right side of your chest at this time is related to having sore muscles because you are coughing.  Based on physical exam findings, believe that you are suffering from upper respiratory allergies that would respond to a daily antihistamine and a nasal steroid spray.  Absent prescriptions for both your pharmacy.  Please take them as directed.  The lymph node on the left side of your neck requires further evaluation.  Please reach out to your primary care provider to discuss having ultrasound of the lymph node.  Any left nose that is greater than 1 cm, or larger than the tip of your pinky, deserves an ultrasound to make sure nothing sinister is going on.  Thank you for visiting [facility] today.  We appreciate the opportunity to participate in your care.

## 2023-02-22 NOTE — ED Triage Notes (Signed)
 Pt c/o nasal congestion since Thanksgiving. States seen and tx'd by a doctor x2. States still having same sx's. States now having rt upper chest pain in one certain spot.

## 2023-02-22 NOTE — ED Provider Notes (Signed)
 [facility]    CSN: [id number] Arrival date & time: 02/22/23  9053    HISTORY   Chief Complaint  Patient presents with   Nasal Congestion    I've been sick since Thanksgiving. I've completed a week of amoxicillin  and then doxycycline . I have nasal drip, stuffy nose at night/early morning, swollen lymph node on the left side of neck and right pain in chest. - Entered by patient   HPI Nathan Fitzgerald is a pleasant, 48 y.o. male who presents to urgent care today. Patient states he has been sick for the past month and a half.  Patient states he has postnasal drip, rhinorrhea, nasal congestion worse at night and early in the morning.  Patient also complains of a swollen lymph node on the right side of his neck and pain in the right side of his chest, states the lymph node became swollen a little over a week ago, got smaller and got bigger over the last 24 hours, states the pain in the right side of his chest began about a week ago as well.  Patient states he has cough in the morning which is nonproductive, causes the right side of his chest to hurt more.  EMR reviewed, patient does have a history of allergies, has been advised to take cetirizine  daily which he is not currently taking.  Patient states he he was taking Sudafed without relief, discontinued about a week ago and is now only taking ibuprofen  which he states is not helpful.  Patient has elevated blood pressure on arrival with otherwise normal vital signs.  The history is provided by the patient.   Past Medical History:  Diagnosis Date   Anxiety    Atypical nevus 10/14/2021   LLQA lateral above the waistline   Moderate recheck at follow up   Chicken pox    Dysplastic nevus 09/29/2021   right UQA periumbilical lat - moderate to severe, Excised 10/14/21  +margins   Heart murmur    slight   Hyperlipidemia    Hypertension    [facility] spotted fever)    as kid- also had several tic borne infections over the years    Patient Active Problem List   Diagnosis Date Noted   Contact dermatitis 04/09/2022   Encounter for HIV screening and discussion of pre-exposure prophylaxis for HIV 04/08/2022   Hyperlipidemia 03/16/2022   COVID-19 05/19/2021   Low testosterone  in male 05/15/2021   Adjustment disorder with mixed anxiety and depressed mood 02/20/2021   Insomnia 02/20/2021   Alcohol abuse 02/20/2021   Inflammatory pain 01/01/2021   Lumbar radiculopathy 01/01/2021   Annual physical exam 03/12/2020   Overweight (BMI 25.0-29.9) 03/12/2020   Murmur 02/25/2020   Former smoker 02/25/2020   Lumbar herniated disc 02/21/2020   Hypertension 02/21/2020   Abnormal MRI, lumbar spine 02/21/2020   Low back pain 07/01/2016   Past Surgical History:  Procedure Laterality Date   TYMPANOSTOMY TUBE PLACEMENT     as kid    Home Medications    Prior to Admission medications   Medication Sig Start Date End Date Taking? Authorizing Provider  amLODipine  (NORVASC ) 5 MG tablet Take 1 tablet (5 mg total) by mouth daily. 03/17/22   Gretel App, NP  amoxicillin -clavulanate (AUGMENTIN ) 875-125 MG tablet Take 1 tablet by mouth 2 (two) times daily. 01/25/23   Vivienne Delon HERO, PA-C  atorvastatin  (LIPITOR) 10 MG tablet Take 1 tablet (10 mg total) by mouth daily. After 6 pm 03/17/22  Gretel App, NP  emtricitabine-tenofovir AF (DESCOVY ) 200-25 MG tablet Take 1 tablet by mouth daily. 04/10/22   Gretel App, NP  losartan -hydrochlorothiazide (HYZAAR) 100-12.5 MG tablet Take 1 tablet by mouth daily. 03/17/22   Gretel App, NP  Multiple Vitamins-Minerals (CENTRUM SILVER 50+MEN PO) Take by mouth daily at 12 noon.    [provider]  predniSONE  (DELTASONE ) 10 MG tablet TAKE 3 TABLETS PO QD FOR 3 DAYS THEN TAKE 2 TABLETS PO QD FOR 3 DAYS THEN TAKE 1 TABLET PO QD FOR 3 DAYS THEN TAKE 1/2 TAB PO QD FOR 3 DAYS 04/09/22   Gretel App, NP    Family History Family History  Adopted: Yes  Family history unknown: Yes   Social  History Social History   Tobacco Use   Smoking status: Former   Smokeless tobacco: Never  Vaping Use   Vaping status: Never Used  Substance Use Topics   Alcohol use: Yes    Alcohol/week: 5.0 standard drinks of alcohol    Types: 5 Standard drinks or equivalent per week    Comment: social   Drug use: Not Currently   Allergies   Patient has no known allergies.  Review of Systems Review of Systems Pertinent findings revealed after performing a 14 point review of systems has been noted in the history of present illness.  Physical Exam Vital Signs BP (!) 165/98 (BP Location: Right Arm)   Pulse 72   Temp 98.1 F (36.7 C) (Oral)   Resp 18   SpO2 98%   No data found.  Physical Exam Vitals and nursing note reviewed.  Constitutional:      General: He is awake. He is not in acute distress.    Appearance: Normal appearance. He is well-developed and well-groomed. He is not ill-appearing.  HENT:     Head: Normocephalic and atraumatic.     Salivary Glands: Right salivary gland is not diffusely enlarged or tender. Left salivary gland is not diffusely enlarged or tender.     Right Ear: Hearing, ear canal and external ear normal. No drainage. No middle ear effusion. There is no impacted cerumen. Tympanic membrane is bulging. Tympanic membrane is not erythematous.     Left Ear: Hearing, ear canal and external ear normal. No drainage.  No middle ear effusion. There is no impacted cerumen. Tympanic membrane is bulging. Tympanic membrane is not erythematous.     Ears:     Comments: Bilateral TMs bulging with clear fluid    Nose: Rhinorrhea present. No nasal deformity, septal deviation, mucosal edema or congestion. Rhinorrhea is clear.     Right Turbinates: Not enlarged, swollen or pale.     Left Turbinates: Not enlarged, swollen or pale.     Right Sinus: No maxillary sinus tenderness or frontal sinus tenderness.     Left Sinus: No maxillary sinus tenderness or frontal sinus tenderness.      Mouth/Throat:     Lips: Pink. No lesions.     Mouth: Mucous membranes are moist. No oral lesions.     Pharynx: Uvula midline. Pharyngeal swelling, posterior oropharyngeal erythema and uvula swelling present. No oropharyngeal exudate.     Tonsils: No tonsillar exudate. 0 on the right. 0 on the left.  Eyes:     General: Lids are normal.        Right eye: No discharge.        Left eye: No discharge.     Extraocular Movements: Extraocular movements intact.     Conjunctiva/sclera: Conjunctivae normal.  Right eye: Right conjunctiva is not injected.     Left eye: Left conjunctiva is not injected.  Neck:     Trachea: Trachea and phonation normal.   Cardiovascular:     Rate and Rhythm: Normal rate and regular rhythm.     Pulses: Normal pulses.     Heart sounds: Normal heart sounds. No murmur heard.    No friction rub. No gallop.  Pulmonary:     Effort: Pulmonary effort is normal. No accessory muscle usage, prolonged expiration or respiratory distress.     Breath sounds: No stridor, decreased air movement or transmitted upper airway sounds. Examination of the right-lower field reveals rales. Rales present. No decreased breath sounds, wheezing or rhonchi.  Chest:     Chest wall: No tenderness.  Musculoskeletal:        General: Normal range of motion.     Cervical back: Normal range of motion and neck supple. Normal range of motion.  Lymphadenopathy:     Cervical: Cervical adenopathy present.     Left cervical: Superficial cervical adenopathy present.  Skin:    General: Skin is warm and dry.     Findings: No erythema or rash.  Neurological:     General: No focal deficit present.     Mental Status: He is alert and oriented to person, place, and time.  Psychiatric:        Mood and Affect: Mood normal.        Behavior: Behavior normal. Behavior is cooperative.     Visual Acuity Right Eye Distance:   Left Eye Distance:   Bilateral Distance:    Right Eye Near:   Left Eye Near:     Bilateral Near:     UC Couse / Diagnostics / Procedures:     Radiology No results found.  Procedures Procedures (including critical care time) EKG  Pending results:  Labs Reviewed - No data to display  Medications Ordered in UC: Medications - No data to display  UC Diagnoses / Final Clinical Impressions(s)   I have reviewed the triage vital signs and the nursing notes.  Pertinent labs & imaging results that were available during my care of the patient were reviewed by me and considered in my medical decision making (see chart for details).    Final diagnoses:  Pleuritic chest pain  Viral URI with cough  Allergic rhinitis, unspecified seasonality, unspecified trigger   Dependent read of chest x-ray was negative for acute cardiopulmonary disease.,  We will notify patient of radiology findings once we receive them.  Patient advised that physical exam findings are most concerning for respiratory allergies, recommend Xyzal  and Nasonex  daily, prescription sent to pharmacy.  No indication for antibiotics at this time.  Conservative care recommended.  Return precautions advised.  Please see discharge instructions below for details of plan of care as provided to patient. ED Prescriptions     Medication Sig Dispense Auth. Provider   levocetirizine (XYZAL ) 5 MG tablet Take 1 tablet (5 mg total) by mouth every evening. 90 tablet Joesph Shaver Scales, PA-C   mometasone  (NASONEX ) 50 MCG/ACT nasal spray Place 2 sprays into the nose daily. 17 g Joesph Shaver Scales, PA-C      PDMP not reviewed this encounter.  Pending results:  Labs Reviewed - No data to display    Discharge Instructions      The x-ray of your chest was not concerning for pneumonia.  I believe that the pain that you are experiencing on the right  side of your chest at this time is related to having sore muscles because you are coughing.  Based on physical exam findings, believe that you are suffering from  upper respiratory allergies that would respond to a daily antihistamine and a nasal steroid spray.  Absent prescriptions for both your pharmacy.  Please take them as directed.  The lymph node on the left side of your neck requires further evaluation.  Please reach out to your primary care provider to discuss having ultrasound of the lymph node.  Any left nose that is greater than 1 cm, or larger than the tip of your pinky, deserves an ultrasound to make sure nothing sinister is going on.  Thank you for visiting [facility] today.  We appreciate the opportunity to participate in your care.      Disposition Upon Discharge:  Condition: stable for discharge home  Patient presented with an acute illness with associated systemic symptoms and significant discomfort requiring urgent management. In my opinion, this is a condition that a prudent lay person (someone who possesses an average knowledge of health and medicine) may potentially expect to result in complications if not addressed urgently such as respiratory distress, impairment of bodily function or dysfunction of bodily organs.   Routine symptom specific, illness specific and/or disease specific instructions were discussed with the patient and/or caregiver at length.   As such, the patient has been evaluated and assessed, work-up was performed and treatment was provided in alignment with urgent care protocols and evidence based medicine.  Patient/parent/caregiver has been advised that the patient may require follow up for further testing and treatment if the symptoms continue in spite of treatment, as clinically indicated and appropriate.  Patient/parent/caregiver has been advised to return to the [facility] or PCP if no better; to PCP or the Emergency Department if new signs and symptoms develop, or if the current signs or symptoms continue to change or worsen for further workup, evaluation and treatment as clinically indicated and  appropriate  The patient will follow up with their current PCP if and as advised. If the patient does not currently have a PCP we will assist them in obtaining one.   The patient may need specialty follow up if the symptoms continue, in spite of conservative treatment and management, for further workup, evaluation, consultation and treatment as clinically indicated and appropriate.  Patient/parent/caregiver verbalized understanding and agreement of plan as discussed.  All questions were addressed during visit.  Please see discharge instructions below for further details of plan.  This office note has been dictated using Teaching laboratory technician.  Unfortunately, this method of dictation can sometimes lead to typographical or grammatical errors.  I apologize for your inconvenience in advance if this occurs.  Please do not hesitate to reach out to me if clarification is needed.      Joesph Shaver Scales, PA-C 02/22/23 1119

## 2023-03-11 ENCOUNTER — Ambulatory Visit (INDEPENDENT_AMBULATORY_CARE_PROVIDER_SITE_OTHER): Payer: 59 | Admitting: Nurse Practitioner

## 2023-03-11 ENCOUNTER — Encounter: Payer: Self-pay | Admitting: Nurse Practitioner

## 2023-03-11 VITALS — BP 136/76 | HR 91 | Temp 98.1°F | Ht 72.0 in | Wt 199.2 lb

## 2023-03-11 DIAGNOSIS — R7989 Other specified abnormal findings of blood chemistry: Secondary | ICD-10-CM | POA: Diagnosis not present

## 2023-03-11 DIAGNOSIS — Z79899 Other long term (current) drug therapy: Secondary | ICD-10-CM

## 2023-03-11 DIAGNOSIS — Z0000 Encounter for general adult medical examination without abnormal findings: Secondary | ICD-10-CM | POA: Diagnosis not present

## 2023-03-11 DIAGNOSIS — G4700 Insomnia, unspecified: Secondary | ICD-10-CM

## 2023-03-11 DIAGNOSIS — E785 Hyperlipidemia, unspecified: Secondary | ICD-10-CM

## 2023-03-11 DIAGNOSIS — I10 Essential (primary) hypertension: Secondary | ICD-10-CM | POA: Diagnosis not present

## 2023-03-11 DIAGNOSIS — Z1329 Encounter for screening for other suspected endocrine disorder: Secondary | ICD-10-CM

## 2023-03-11 DIAGNOSIS — R454 Irritability and anger: Secondary | ICD-10-CM

## 2023-03-11 DIAGNOSIS — F4323 Adjustment disorder with mixed anxiety and depressed mood: Secondary | ICD-10-CM | POA: Diagnosis not present

## 2023-03-11 DIAGNOSIS — Z0001 Encounter for general adult medical examination with abnormal findings: Secondary | ICD-10-CM

## 2023-03-11 MED ORDER — SERTRALINE HCL 50 MG PO TABS: 50.0000 mg | ORAL_TABLET | Freq: Every day | ORAL | 0 refills | Status: DC
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
## 2023-03-18 ENCOUNTER — Encounter: Payer: Self-pay | Admitting: Nurse Practitioner

## 2023-03-18 DIAGNOSIS — Z0001 Encounter for general adult medical examination with abnormal findings: Secondary | ICD-10-CM | POA: Insufficient documentation

## 2023-03-18 NOTE — Assessment & Plan Note (Signed)
There has been increased irritability and anger over the past six months, with a history of mood issues and previous use of anxiety medication. Start on Zoloft 50mg  daily at bedtime. Counseled patient on common side effects. Encouraged to contact if worsening symptoms, unusual behavior changes or suicidal thoughts occur. He will follow up in 6 weeks, sooner if needed.

## 2023-03-18 NOTE — Assessment & Plan Note (Signed)
See plan for irritability. PHQ- 6 and GAD- 4 today. Starting Zoloft 50mg  daily.

## 2023-03-18 NOTE — Assessment & Plan Note (Addendum)
BP consistently elevated recently. Currently on Losartan-Hydrochlorothiazide 100-12.5mg  daily and Norvasc 5mg  daily, but Norvasc use is inconsistent. Resume Norvasc, counseled on importance of taking consistently. Continue Losartan-Hydrochlorothiazide. We will recheck in 6 weeks at follow up.

## 2023-03-18 NOTE — Assessment & Plan Note (Addendum)
Physical exam complete. Lab work as outlined, patient will return when fasting to complete. Colonoscopy is up to to date. He politely declined the flu vaccine today. Tetanus vaccine is up to date and he has received 3 COVID vaccines, declines additional. Continue routine dental exams. Encourage regular exercise and dietary improvements. Advise scheduling an appointment with an eye doctor due to changes in vision. Return to care in 6 weeks, sooner if needed.

## 2023-03-18 NOTE — Assessment & Plan Note (Signed)
Not currently taking Lipitor. Resume Lipitor 10mg  daily and check lipid panel.

## 2023-03-18 NOTE — Assessment & Plan Note (Signed)
Long history of difficulty staying asleep, with multiple awakenings during the night, despite a normal sleep study. Has tried multiple medications and OTC medications without relief. Address the mood disorder first to see if sleep improves.

## 2023-03-18 NOTE — Assessment & Plan Note (Signed)
Previous low levels with no follow-up with urology. Consider referral to urology if mood and sleep issues do not improve with the current plan. We will check testosterone level today.

## 2023-03-23 ENCOUNTER — Ambulatory Visit: Payer: 59 | Admitting: Nurse Practitioner

## 2023-03-23 ENCOUNTER — Other Ambulatory Visit: Payer: 59

## 2023-03-24 ENCOUNTER — Encounter: Payer: Self-pay | Admitting: Pharmacist

## 2023-03-29 ENCOUNTER — Other Ambulatory Visit (INDEPENDENT_AMBULATORY_CARE_PROVIDER_SITE_OTHER): Payer: 59

## 2023-03-29 DIAGNOSIS — R7989 Other specified abnormal findings of blood chemistry: Secondary | ICD-10-CM | POA: Diagnosis not present

## 2023-03-29 DIAGNOSIS — R454 Irritability and anger: Secondary | ICD-10-CM

## 2023-03-29 DIAGNOSIS — Z1329 Encounter for screening for other suspected endocrine disorder: Secondary | ICD-10-CM

## 2023-03-29 DIAGNOSIS — E785 Hyperlipidemia, unspecified: Secondary | ICD-10-CM

## 2023-03-29 DIAGNOSIS — I10 Essential (primary) hypertension: Secondary | ICD-10-CM | POA: Diagnosis not present

## 2023-03-29 DIAGNOSIS — Z79899 Other long term (current) drug therapy: Secondary | ICD-10-CM | POA: Diagnosis not present

## 2023-03-29 LAB — COMPREHENSIVE METABOLIC PANEL
ALT: 20 U/L (ref 0–53)
AST: 20 U/L (ref 0–37)
Albumin: 4.3 g/dL (ref 3.5–5.2)
Alkaline Phosphatase: 76 U/L (ref 39–117)
BUN: 16 mg/dL (ref 6–23)
CO2: 28 meq/L (ref 19–32)
Calcium: 9 mg/dL (ref 8.4–10.5)
Chloride: 102 meq/L (ref 96–112)
Creatinine, Ser: 0.84 mg/dL (ref 0.40–1.50)
GFR: 103.47 mL/min (ref >60.00)
Glucose, Bld: 92 mg/dL (ref 70–99)
Potassium: 4.3 meq/L (ref 3.5–5.1)
Sodium: 141 meq/L (ref 135–145)
Total Bilirubin: 0.7 mg/dL (ref 0.2–1.2)
Total Protein: 7.1 g/dL (ref 6.0–8.3)

## 2023-03-29 LAB — LIPID PANEL
Cholesterol: 159 mg/dL (ref 0–200)
HDL: 63.7 mg/dL (ref >39.00)
LDL Cholesterol: 73 mg/dL (ref 0–99)
NonHDL: 94.92
Total CHOL/HDL Ratio: 2
Triglycerides: 109 mg/dL (ref 0.0–149.0)
VLDL: 21.8 mg/dL (ref 0.0–40.0)

## 2023-03-29 LAB — CBC WITH DIFFERENTIAL/PLATELET
Basophils Absolute: 0 10*3/uL (ref 0.0–0.1)
Basophils Relative: 0.4 % (ref 0.0–3.0)
Eosinophils Absolute: 0.1 10*3/uL (ref 0.0–0.7)
Eosinophils Relative: 1.7 % (ref 0.0–5.0)
HCT: 41.9 % (ref 39.0–52.0)
Hemoglobin: 14.2 g/dL (ref 13.0–17.0)
Lymphocytes Relative: 36.3 % (ref 12.0–46.0)
Lymphs Abs: 2 10*3/uL (ref 0.7–4.0)
MCHC: 33.9 g/dL (ref 30.0–36.0)
MCV: 92.4 fL (ref 78.0–100.0)
Monocytes Absolute: 0.5 10*3/uL (ref 0.1–1.0)
Monocytes Relative: 9.1 % (ref 3.0–12.0)
Neutro Abs: 2.9 10*3/uL (ref 1.4–7.7)
Neutrophils Relative %: 52.5 % (ref 43.0–77.0)
Platelets: 256 10*3/uL (ref 150.0–400.0)
RBC: 4.54 Mil/uL (ref 4.22–5.81)
RDW: 13.7 % (ref 11.5–15.5)
WBC: 5.5 10*3/uL (ref 4.0–10.5)

## 2023-03-29 LAB — VITAMIN B12: Vitamin B-12: 353 pg/mL (ref 211–911)

## 2023-03-29 LAB — TESTOSTERONE: Testosterone: 254.86 ng/dL — ABNORMAL LOW (ref 300.00–890.00)

## 2023-03-29 LAB — VITAMIN D 25 HYDROXY (VIT D DEFICIENCY, FRACTURES): VITD: 39.65 ng/mL (ref 30.00–100.00)

## 2023-03-29 LAB — TSH: TSH: 1.43 u[IU]/mL (ref 0.35–5.50)

## 2023-03-30 ENCOUNTER — Encounter: Payer: Self-pay | Admitting: Nurse Practitioner

## 2023-03-30 LAB — HIV ANTIBODY (ROUTINE TESTING W REFLEX): HIV 1&2 Ab, 4th Generation: NONREACTIVE

## 2023-03-31 ENCOUNTER — Other Ambulatory Visit: Payer: Self-pay | Admitting: Nurse Practitioner

## 2023-03-31 DIAGNOSIS — R7989 Other specified abnormal findings of blood chemistry: Secondary | ICD-10-CM

## 2023-03-31 DIAGNOSIS — Z114 Encounter for screening for human immunodeficiency virus [HIV]: Secondary | ICD-10-CM

## 2023-03-31 MED ORDER — DESCOVY 200-25 MG PO TABS: 1.0000 | ORAL_TABLET | Freq: Every day | ORAL | 11 refills | Status: DC

## 2023-03-31 NOTE — Addendum Note (Signed)
Addended by: Donavan Foil on: 03/31/2023 11:45 AM   Modules accepted: Orders

## 2023-04-09 ENCOUNTER — Other Ambulatory Visit: Payer: Self-pay | Admitting: Nurse Practitioner

## 2023-04-09 DIAGNOSIS — I10 Essential (primary) hypertension: Secondary | ICD-10-CM

## 2023-04-13 NOTE — Progress Notes (Deleted)
 04/15/2023 9:57 AM   [name] 01/12/1976 [id number]  Referring provider: Bethanie Dicker, NP [address],  [place] [address]  Urological history: 1. Hypogonadism -Contributing factors of age, obesity and alcohol abuse -testosterone level (03/29/2023) 254.86 -prolactin (05/2021) 6.8 -free testosterone (05/2021) 6.6 -LH/FSH (05/2021) 3.6/2.0 -Estradiol (05/2021) 19.6  No chief complaint on file.  HPI: Nathan Fitzgerald is a 48 y.o. male who presents today for low testosterone.    Previous records reviewed.   I PSS ***    Score:  1-7 Mild 8-19 Moderate 20-35 Severe   SHIM ***    Score: 1-7 Severe ED 8-11 Moderate ED 12-16 Mild-Moderate ED 17-21 Mild ED 22-25 No ED   PMH: Past Medical History:  Diagnosis Date   Anxiety    Atypical nevus 10/14/2021   LLQA lateral above the waistline   Moderate recheck at follow up   Chicken pox    Dysplastic nevus 09/29/2021   right UQA periumbilical lat - moderate to severe, Excised 10/14/21  +margins   Heart murmur    slight   Hyperlipidemia    Hypertension    [facility] spotted fever)    as kid- also had several tic borne infections over the years    Surgical History: Past Surgical History:  Procedure Laterality Date   TYMPANOSTOMY TUBE PLACEMENT     as kid    Home Medications:  Allergies as of 04/15/2023   No Known Allergies      Medication List        Accurate as of April 13, 2023  9:57 AM. If you have any questions, ask your nurse or doctor.          amLODipine 5 MG tablet Commonly known as: NORVASC TAKE ONE TABLET BY MOUTH ONCE A DAY   atorvastatin 10 MG tablet Commonly known as: LIPITOR Take 1 tablet (10 mg total) by mouth daily. After 6 pm   CENTRUM SILVER 50+MEN PO Take by mouth daily at 12 noon.   Descovy 200-25 MG tablet Generic drug: emtricitabine-tenofovir AF Take 1 tablet by mouth daily.   levocetirizine 5 MG tablet Commonly known as:  XYZAL Take 1 tablet (5 mg total) by mouth every evening.   losartan-hydrochlorothiazide 100-12.5 MG tablet Commonly known as: HYZAAR Take 1 tablet by mouth daily.   mometasone 50 MCG/ACT nasal spray Commonly known as: NASONEX Place 2 sprays into the nose daily.   sertraline 50 MG tablet Commonly known as: ZOLOFT Take 1 tablet (50 mg total) by mouth at bedtime.        Allergies: No Known Allergies  Family History: Family History  Adopted: Yes  Family history unknown: Yes    Social History:  reports that he has quit smoking. He has never used smokeless tobacco. He reports current alcohol use of about 5.0 standard drinks of alcohol per week. He reports that he does not currently use drugs.  ROS: Pertinent ROS in HPI  Physical Exam: There were no vitals taken for this visit.  Constitutional:  Well nourished. Alert and oriented, No acute distress. HEENT: [place] AT, moist mucus membranes.  Trachea midline, no masses. Cardiovascular: No clubbing, cyanosis, or edema. Respiratory: Normal respiratory effort, no increased work of breathing. GI: Abdomen is soft, non tender, non distended, no abdominal masses. Liver and spleen not palpable.  No hernias appreciated.  Stool sample for occult testing is not indicated.   GU: No CVA tenderness.  No bladder fullness or masses.  Patient with circumcised/uncircumcised phallus. ***Foreskin easily retracted***  Urethral meatus is patent.  No penile discharge. No penile lesions or rashes. Scrotum without lesions, cysts, rashes and/or edema.  Testicles are located scrotally bilaterally. No masses are appreciated in the testicles. Left and right epididymis are normal. Rectal: Patient with  normal sphincter tone. Anus and perineum without scarring or rashes. No rectal masses are appreciated. Prostate is approximately *** grams, *** nodules are appreciated. Seminal vesicles are normal. Skin: No rashes, bruises or suspicious lesions. Lymph: No cervical or  inguinal adenopathy. Neurologic: Grossly intact, no focal deficits, moving all 4 extremities. Psychiatric: Normal mood and affect.  Laboratory Data: Lab Results  Component Value Date   WBC 5.5 03/29/2023   HGB 14.2 03/29/2023   HCT 41.9 03/29/2023   MCV 92.4 03/29/2023   PLT 256.0 03/29/2023    Lab Results  Component Value Date   CREATININE 0.84 03/29/2023    Lab Results  Component Value Date   TESTOSTERONE 254.86 (L) 03/29/2023    Lab Results  Component Value Date   TSH 1.43 03/29/2023       Component Value Date/Time   CHOL 159 03/29/2023 0849   HDL 63.70 03/29/2023 0849   CHOLHDL 2 03/29/2023 0849   VLDL 21.8 03/29/2023 0849   LDLCALC 73 03/29/2023 0849    Lab Results  Component Value Date   AST 20 03/29/2023   Lab Results  Component Value Date   ALT 20 03/29/2023  I have reviewed the labs.   Pertinent Imaging: N/A  Assessment & Plan:  ***  1. Testosterone deficiency/Hypogonadism  -explained that the diagnosis of testosterone deficiency/hypogonadism requires two morning testosterones at least two days apart below 300 to meet criteria** -explained that TRT is not a treatment for ED, he may see some improvement in his erections, but his ED will likely persist even with therapeutic levels of testosterone*** -Near castrate testosterone levels*** -Significant symptoms*** -Recommend starting TRT*** -We discussed the most common forms of replacement including intramuscular injection and gels and he desires to start injections*** -Rx testosterone cypionate-200 mg every 2 weeks to start*** -Appointment will be made for injection training*** -Follow-up 6 weeks after starting TRT for testosterone level and symptom check*** -Potential side effects of testosterone replacement were discussed including stimulation of benign prostatic growth with lower urinary tract symptoms; erythrocytosis; edema; gynecomastia; worsening sleep apnea; venous thromboembolism;  testicular atrophy and infertility. Recent studies suggesting an increased incidence of heart attack and stroke in patients taking testosterone was discussed. He was informed there is conflicting evidence regarding the impact of testosterone therapy on cardiovascular risk. The theoretical risk of growth stimulation of an undetected prostate cancer was also discussed.  He was informed that current evidence does not provide any definitive answers regarding the risks of testosterone therapy on prostate cancer and cardiovascular disease. The need for periodic monitoring of his testosterone level, PSA, hematocrit and DRE was discussed.  2. BPH with LUTS -PSA stable *** -DRE benign *** -UA benign *** -PVR < 300 cc *** -symptoms - *** -most bothersome symptoms are *** -continue conservative management, avoiding bladder irritants and timed voiding's -Initiate alpha-blocker (***), discussed side effects *** -Initiate 5 alpha reductase inhibitor (***), discussed side effects *** -Continue tamsulosin 0.4 mg daily, alfuzosin 10 mg daily, Rapaflo 8 mg daily, terazosin, doxazosin, Cialis 5 mg daily and finasteride 5 mg daily, dutasteride 0.5 mg daily***:refills given -Cannot tolerate medication or medication failure, schedule cystoscopy ***   3. Erectile dysfunction - I explained to the patient  that in order to achieve an erection it takes good functioning of the nervous system (parasympathetic and rs, sympathetic, sensory and motor), good blood flow into the erectile tissue of the penis and a desire to have sex - I explained that conditions like diabetes, hypertension, coronary artery disease, peripheral vascular disease, smoking, alcohol consumption, age, sleep apnea and BPH can diminish the ability to have an erection - I explained the ED may be a risk marker for underlying CVD and he should follow up with PCP for further studies *** - we will obtain a serum testosterone level at this time; if it is abnormal  we will need to repeat the study for confirmation *** - A recent study published in Sex Med 2018 Apr 13 revealed moderate to vigorous aerobic exercise for 40 minutes 4 times per week can decrease erectile problems caused by physical inactivity, obesity, hypertension, metabolic syndrome and/or cardiovascular diseases *** - We discussed trying a *** different PDE5 inhibitor, intra-urethral suppositories, intracavernous vasoactive drug injection therapy, vacuum erection devices, LI-ESWT and penile prosthesis implantation     No follow-ups on file.  These notes generated with voice recognition software. I apologize for typographical errors.  Cloretta Ned  [facility] [address]  [address] [place], [place] [address] [phone]

## 2023-04-15 ENCOUNTER — Encounter: Payer: Self-pay | Admitting: Urology

## 2023-04-15 ENCOUNTER — Ambulatory Visit: Payer: 59 | Admitting: Urology

## 2023-04-15 DIAGNOSIS — N529 Male erectile dysfunction, unspecified: Secondary | ICD-10-CM

## 2023-04-15 DIAGNOSIS — E291 Testicular hypofunction: Secondary | ICD-10-CM

## 2023-04-15 DIAGNOSIS — N401 Enlarged prostate with lower urinary tract symptoms: Secondary | ICD-10-CM

## 2023-04-22 ENCOUNTER — Ambulatory Visit: Payer: 59 | Admitting: Nurse Practitioner

## 2023-04-22 ENCOUNTER — Encounter: Payer: Self-pay | Admitting: Nurse Practitioner

## 2023-04-22 VITALS — BP 120/74 | HR 71 | Temp 98.2°F | Ht 72.0 in | Wt 192.8 lb

## 2023-04-22 DIAGNOSIS — R454 Irritability and anger: Secondary | ICD-10-CM | POA: Diagnosis not present

## 2023-04-22 DIAGNOSIS — R7989 Other specified abnormal findings of blood chemistry: Secondary | ICD-10-CM | POA: Diagnosis not present

## 2023-04-22 DIAGNOSIS — F4323 Adjustment disorder with mixed anxiety and depressed mood: Secondary | ICD-10-CM | POA: Diagnosis not present

## 2023-04-22 DIAGNOSIS — G4700 Insomnia, unspecified: Secondary | ICD-10-CM | POA: Diagnosis not present

## 2023-04-22 MED ORDER — SERTRALINE HCL 100 MG PO TABS: 100.0000 mg | ORAL_TABLET | Freq: Every day | ORAL | 0 refills | Status: DC

## 2023-04-22 MED ORDER — TEMAZEPAM 7.5 MG PO CAPS
7.5000 mg | ORAL_CAPSULE | Freq: Every evening | ORAL | 1 refills | Status: DC | PRN
Start: 2023-04-22 — End: 2023-06-17

## 2023-04-22 NOTE — Progress Notes (Signed)
 Bethanie Dicker, NP-C Phone: [phone]  Nathan Fitzgerald is a 48 y.o. male who presents today for follow up.   Discussed the use of AI scribe software for clinical note transcription with the patient, who gave verbal consent to proceed.  History of Present Illness   Nathan Fitzgerald "Nathan Fitzgerald" is a 48 year old male with a history of anxiety and depression who presents for follow-up on mood management.  He started Zoloft approximately six weeks ago for irritability and a short temper. Initially, he experienced feeling like a 'zombie' during the first week of treatment. He has noticed occasional improvements in mood, describing moments of being in a good mood and being able to talk himself out of anger. Despite these improvements, his mood is not where he wants it to be, with episodes of unexplained anger, particularly on Tuesday and Wednesday of the current week.  He reports significant issues with sleep, describing it as 'absolute trash.' Initially, he took Zoloft at bedtime, which disrupted his sleep, so he switched to taking it in the mornings. He has been using his husband's trazodone to aid sleep, but it has not been effective in maintaining sleep. He describes waking up with a racing heart and mind, which prevents him from returning to sleep. He has tried over-the-counter sleep aids like Benadryl without success. He plans naps during work due to consecutive rough nights of sleep. No gasping for air during sleep.  He mentions experiencing sexual side effects at the beginning of Zoloft treatment, which have since resolved. He also reports a lack of motivation and energy, often feeling like he just wants to lay in bed and 'sulk.' He attributes some of this to tiredness from poor sleep. He has a routine of going to bed by 10 PM and can fall asleep easily, but staying asleep is problematic.      Social History   Tobacco Use  Smoking Status Former  Smokeless Tobacco Never    Current Outpatient  Medications on File Prior to Visit  Medication Sig Dispense Refill   amLODipine (NORVASC) 5 MG tablet TAKE ONE TABLET BY MOUTH ONCE A DAY 90 tablet 3   atorvastatin (LIPITOR) 10 MG tablet Take 1 tablet (10 mg total) by mouth daily. After 6 pm 90 tablet 3   cyanocobalamin (VITAMIN B12) 1000 MCG tablet Take 1,000 mcg by mouth daily.     emtricitabine-tenofovir AF (DESCOVY) 200-25 MG tablet Take 1 tablet by mouth daily. 30 tablet 11   levocetirizine (XYZAL) 5 MG tablet Take 1 tablet (5 mg total) by mouth every evening. 90 tablet 0   losartan-hydrochlorothiazide (HYZAAR) 100-12.5 MG tablet Take 1 tablet by mouth daily. 90 tablet 3   mometasone (NASONEX) 50 MCG/ACT nasal spray Place 2 sprays into the nose daily. 17 g 2   Multiple Vitamins-Minerals (CENTRUM SILVER 50+MEN PO) Take by mouth daily at 12 noon.     No current facility-administered medications on file prior to visit.     ROS see history of present illness  Objective  Physical Exam Vitals:   04/22/23 1005  BP: 120/74  Pulse: 71  Temp: 98.2 F (36.8 C)  SpO2: 98%    BP Readings from Last 3 Encounters:  04/22/23 120/74  03/11/23 136/76  02/22/23 (!) 165/98   Wt Readings from Last 3 Encounters:  04/22/23 192 lb 12.8 oz (87.5 kg)  03/11/23 199 lb 3.2 oz (90.4 kg)  04/09/22 197 lb 9.6 oz (89.6 kg)    Physical Exam Constitutional:  General: He is not in acute distress.    Appearance: Normal appearance.  HENT:     Head: Normocephalic.  Cardiovascular:     Rate and Rhythm: Normal rate and regular rhythm.     Heart sounds: Normal heart sounds.  Pulmonary:     Effort: Pulmonary effort is normal.     Breath sounds: Normal breath sounds.  Skin:    General: Skin is warm and dry.  Neurological:     General: No focal deficit present.     Mental Status: He is alert.  Psychiatric:        Mood and Affect: Mood normal.        Behavior: Behavior normal.     Assessment/Plan: Please see individual problem  list.  Adjustment disorder with mixed anxiety and depressed mood Assessment & Plan: Zoloft was started six weeks ago, and initial side effects have resolved. Some mood improvement is noted, but episodes of intense anger and poor sleep continue. The current dose is suboptimal. Increase Zoloft to 100 mg daily, using 2 x 50 mg tablets until current supply is gone, then prescribe 100 mg tablets. Monitor for new or unusual side effects. Follow up in six weeks to assess mood and sleep.  Orders: -     Sertraline HCl; Take 1 tablet (100 mg total) by mouth daily.  Dispense: 90 tablet; Refill: 0  Irritability and anger Assessment & Plan: See plan for anx/dep. Increasing Zoloft to 100 mg daily.   Orders: -     Sertraline HCl; Take 1 tablet (100 mg total) by mouth daily.  Dispense: 90 tablet; Refill: 0  Insomnia, unspecified type Assessment & Plan: He has difficulty maintaining sleep, accompanied by anxiety and tachycardia. Trazodone is ineffective, and insomnia is likely anxiety-related. We will trial Restoril at bedtime. Follow up in six weeks to assess effectiveness. PDMP reviewed.   Orders: -     Temazepam; Take 1-2 capsules (7.5-15 mg total) by mouth at bedtime as needed for sleep.  Dispense: 45 capsule; Refill: 1  Low testosterone in male Assessment & Plan: He missed his urology appointment. Emphasize the importance of attending for comprehensive management. Reschedule and attend the urology appointment for further evaluation and management.     Return in about 6 weeks (around 06/03/2023) for Anxiety/Depression.   Bethanie Dicker, NP-C [facility]

## 2023-04-22 NOTE — Assessment & Plan Note (Signed)
 See plan for anx/dep. Increasing Zoloft to 100 mg daily.

## 2023-04-22 NOTE — Assessment & Plan Note (Signed)
 He missed his urology appointment. Emphasize the importance of attending for comprehensive management. Reschedule and attend the urology appointment for further evaluation and management.

## 2023-04-22 NOTE — Assessment & Plan Note (Signed)
 He has difficulty maintaining sleep, accompanied by anxiety and tachycardia. Trazodone is ineffective, and insomnia is likely anxiety-related. We will trial Restoril at bedtime. Follow up in six weeks to assess effectiveness. PDMP reviewed.

## 2023-04-22 NOTE — Assessment & Plan Note (Signed)
 Zoloft was started six weeks ago, and initial side effects have resolved. Some mood improvement is noted, but episodes of intense anger and poor sleep continue. The current dose is suboptimal. Increase Zoloft to 100 mg daily, using 2 x 50 mg tablets until current supply is gone, then prescribe 100 mg tablets. Monitor for new or unusual side effects. Follow up in six weeks to assess mood and sleep.

## 2023-04-29 ENCOUNTER — Other Ambulatory Visit (HOSPITAL_COMMUNITY): Payer: Self-pay

## 2023-04-29 ENCOUNTER — Telehealth: Payer: Self-pay

## 2023-04-29 NOTE — Telephone Encounter (Signed)
 Pharmacy Patient Advocate Encounter  Received notification from [facility] that Prior Authorization for Temazepam 7.5MG  capsules has been CANCELLED due to  Was not working pt request to stop taking because it did not help with sleep no PA IS NEEDED  PA #/Case ID/Reference #: KEY [id number]

## 2023-05-25 ENCOUNTER — Other Ambulatory Visit: Payer: Self-pay | Admitting: Nurse Practitioner

## 2023-05-25 DIAGNOSIS — I10 Essential (primary) hypertension: Secondary | ICD-10-CM

## 2023-05-25 NOTE — Telephone Encounter (Signed)
 Copied from CRM [id number]. Topic: Clinical - Medication Refill >> May 25, 2023  4:36 PM Florestine Avers wrote: Most Recent Primary Care Visit:  Provider: Bethanie Dicker  Department: [facility]  Visit Type: OFFICE VISIT  Date: 04/22/2023  Medication: losartan-hydrochlorothiazide (HYZAAR) 100-12.5 MG table  Has the patient contacted their pharmacy? Yes (Agent: If no, request that the patient contact the pharmacy for the refill. If patient does not wish to contact the pharmacy document the reason why and proceed with request.) (Agent: If yes, when and what did the pharmacy advise?)  Is this the correct pharmacy for this prescription? Yes If no, delete pharmacy and type the correct one.  This is the patient's preferred pharmacy:  [facility] - [place], [place] - [address] [place] [address] Phone: [phone] Fax: [phone]   Has the prescription been filled recently? Yes  Is the patient out of the medication? Yes  Has the patient been seen for an appointment in the last year OR does the patient have an upcoming appointment? Yes  Can we respond through MyChart? Yes  Agent: Please be advised that Rx refills may take up to 3 business days. We ask that you follow-up with your pharmacy.

## 2023-05-25 NOTE — Telephone Encounter (Signed)
 Last Fill: 03/17/22  Last OV: 04/22/23 Next OV: 06/03/23  Routing to provider for review/authorization.

## 2023-05-26 MED ORDER — LOSARTAN POTASSIUM-HCTZ 100-12.5 MG PO TABS: 1.0000 | ORAL_TABLET | Freq: Every day | ORAL | 0 refills | Status: DC

## 2023-06-03 ENCOUNTER — Ambulatory Visit: Admitting: Nurse Practitioner

## 2023-06-17 ENCOUNTER — Encounter: Payer: Self-pay | Admitting: Nurse Practitioner

## 2023-06-17 ENCOUNTER — Ambulatory Visit: Admitting: Nurse Practitioner

## 2023-06-17 VITALS — BP 128/80 | HR 60 | Temp 98.8°F | Ht 72.0 in | Wt 194.0 lb

## 2023-06-17 DIAGNOSIS — F1010 Alcohol abuse, uncomplicated: Secondary | ICD-10-CM

## 2023-06-17 DIAGNOSIS — F4323 Adjustment disorder with mixed anxiety and depressed mood: Secondary | ICD-10-CM | POA: Diagnosis not present

## 2023-06-17 DIAGNOSIS — Z1283 Encounter for screening for malignant neoplasm of skin: Secondary | ICD-10-CM | POA: Diagnosis not present

## 2023-06-17 DIAGNOSIS — G4700 Insomnia, unspecified: Secondary | ICD-10-CM

## 2023-06-17 MED ORDER — TRAZODONE HCL 100 MG PO TABS: 150.0000 mg | ORAL_TABLET | Freq: Every day | ORAL | 1 refills | Status: DC

## 2023-06-17 MED ORDER — FLUOXETINE HCL 20 MG PO TABS: 20.0000 mg | ORAL_TABLET | Freq: Every day | ORAL | 0 refills | Status: DC

## 2023-06-17 NOTE — Progress Notes (Signed)
 Bluford Burkitt, NP-C Phone: [phone]  Nathan Fitzgerald is a 48 y.o. male who presents today for follow up.   Discussed the use of AI scribe software for clinical note transcription with the patient, who gave verbal consent to proceed.  History of Present Illness   Nathan Fitzgerald "Geralyn Knee" is a 48 year old male who presents with mood changes and alcohol cessation.  He has experienced significant mood changes, transitioning from irritability to apathy, with a lack of motivation to work and a desire to stay in bed. These symptoms began after increasing his Zoloft  dosage, which he has since stopped due to feeling emotionally numb and like a zombie. He is concerned about medication side effects, particularly sexual dysfunction, which he experienced with Zoloft .  He quit drinking alcohol two weeks ago, having previously consumed approximately a gallon of vodka every week to week and a half as a coping mechanism and for sleep. Since stopping, he experiences intense cravings and describes himself as sneaky about drinking, though he occasionally has a glass of wine. He wants to stop drinking and improve his mood and sleep.  He has significant sleep disturbances, characterized by difficulty staying asleep. He falls asleep easily but wakes up after a few hours, unable to return to sleep due to racing thoughts. He previously used trazodone , which was more effective than his current state, but is not taking it now.  He has a family history of addiction on both sides and has always tried to limit his substance use to alcohol and marijuana. He has a supportive boyfriend and a daughter who assists him when needed.      Social History   Tobacco Use  Smoking Status Former  Smokeless Tobacco Never    Current Outpatient Medications on File Prior to Visit  Medication Sig Dispense Refill   amLODipine  (NORVASC ) 5 MG tablet TAKE ONE TABLET BY MOUTH ONCE A DAY 90 tablet 3   atorvastatin  (LIPITOR) 10 MG tablet  Take 1 tablet (10 mg total) by mouth daily. After 6 pm 90 tablet 3   cyanocobalamin  (VITAMIN B12) 1000 MCG tablet Take 1,000 mcg by mouth daily.     emtricitabine-tenofovir AF (DESCOVY ) 200-25 MG tablet Take 1 tablet by mouth daily. 30 tablet 11   levocetirizine (XYZAL ) 5 MG tablet Take 1 tablet (5 mg total) by mouth every evening. 90 tablet 0   losartan -hydrochlorothiazide (HYZAAR) 100-12.5 MG tablet Take 1 tablet by mouth daily. 90 tablet 0   Multiple Vitamins-Minerals (CENTRUM SILVER 50+MEN PO) Take by mouth daily at 12 noon.     No current facility-administered medications on file prior to visit.    ROS see history of present illness  Objective  Physical Exam Vitals:   06/17/23 1040  BP: 128/80  Pulse: 60  Temp: 98.8 F (37.1 C)  SpO2: 95%    BP Readings from Last 3 Encounters:  06/17/23 128/80  04/22/23 120/74  03/11/23 136/76   Wt Readings from Last 3 Encounters:  06/17/23 194 lb (88 kg)  04/22/23 192 lb 12.8 oz (87.5 kg)  03/11/23 199 lb 3.2 oz (90.4 kg)    Physical Exam Constitutional:      General: He is not in acute distress.    Appearance: Normal appearance.  HENT:     Head: Normocephalic.  Cardiovascular:     Rate and Rhythm: Normal rate and regular rhythm.     Heart sounds: Normal heart sounds.  Pulmonary:     Effort: Pulmonary effort is normal.  Breath sounds: Normal breath sounds.  Skin:    General: Skin is warm and dry.  Neurological:     General: No focal deficit present.     Mental Status: He is alert.  Psychiatric:        Mood and Affect: Mood is depressed. Affect is tearful.        Behavior: Behavior normal.      Assessment/Plan: Please see individual problem list.  Adjustment disorder with mixed anxiety and depressed mood Assessment & Plan: His depression symptoms worsened after increasing Zoloft , which was discontinued due to adverse effects. There is consideration of underlying mood disorders, including possible bipolar  disorder. Initiate Prozac  for depression management and monitor for adverse effects and efficacy. Counseled on common side effects. Refer to psychiatry for further evaluation and management of mood disorders. Discuss the potential addition of Wellbutrin  if sexual side effects from Prozac  occur, deferring until mood stabilizes. PHQ- 14 and GAD- 7 today. Encouraged to contact if worsening symptoms, unusual behavior changes or suicidal thoughts occur. Follow up in 4 weeks.   Orders: -     FLUoxetine  HCl; Take 1 tablet (20 mg total) by mouth daily.  Dispense: 90 tablet; Refill: 0 -     Ambulatory referral to Psychiatry  Alcohol abuse Assessment & Plan: He recently ceased alcohol use and experiences intense cravings and difficulty managing without it, compounded by a strong family history of addiction. He is open to attending AA meetings but hesitant about inpatient treatment. Refer him to psychiatry for evaluation and management of alcohol dependence and mood disorders. Provide resources for outpatient alcohol treatment programs and AA meetings. Encourage continued abstinence from alcohol.   Orders: -     Ambulatory referral to Psychiatry  Insomnia, unspecified type Assessment & Plan: Chronic insomnia is exacerbated by alcohol cessation and Zoloft  discontinuation. Previous trazodone  use provided some relief. Prescribe trazodone  100 mg, with the option to increase to 150-200 mg as needed for sleep. Discuss sleep hygiene practices and the importance of a consistent sleep routine.   Orders: -     traZODone  HCl; Take 1.5-2 tablets (150-200 mg total) by mouth at bedtime.  Dispense: 180 tablet; Refill: 1 -     Ambulatory referral to Psychiatry  Skin cancer screening -     Ambulatory referral to Dermatology     Return in about 4 weeks (around 07/15/2023) for Anxiety/Depression.   Bluford Burkitt, NP-C [facility]

## 2023-06-28 ENCOUNTER — Encounter: Payer: Self-pay | Admitting: Nurse Practitioner

## 2023-06-28 NOTE — Assessment & Plan Note (Signed)
 He recently ceased alcohol use and experiences intense cravings and difficulty managing without it, compounded by a strong family history of addiction. He is open to attending AA meetings but hesitant about inpatient treatment. Refer him to psychiatry for evaluation and management of alcohol dependence and mood disorders. Provide resources for outpatient alcohol treatment programs and AA meetings. Encourage continued abstinence from alcohol.

## 2023-06-28 NOTE — Assessment & Plan Note (Signed)
 Chronic insomnia is exacerbated by alcohol cessation and Zoloft  discontinuation. Previous trazodone  use provided some relief. Prescribe trazodone  100 mg, with the option to increase to 150-200 mg as needed for sleep. Discuss sleep hygiene practices and the importance of a consistent sleep routine.

## 2023-06-28 NOTE — Assessment & Plan Note (Signed)
 His depression symptoms worsened after increasing Zoloft , which was discontinued due to adverse effects. There is consideration of underlying mood disorders, including possible bipolar disorder. Initiate Prozac  for depression management and monitor for adverse effects and efficacy. Counseled on common side effects. Refer to psychiatry for further evaluation and management of mood disorders. Discuss the potential addition of Wellbutrin  if sexual side effects from Prozac  occur, deferring until mood stabilizes. PHQ- 14 and GAD- 7 today. Encouraged to contact if worsening symptoms, unusual behavior changes or suicidal thoughts occur. Follow up in 4 weeks.

## 2023-07-09 ENCOUNTER — Other Ambulatory Visit: Payer: Self-pay | Admitting: Medical Genetics

## 2023-07-14 ENCOUNTER — Other Ambulatory Visit
Admission: RE | Admit: 2023-07-14 | Discharge: 2023-07-14 | Disposition: A | Discharge status: Home / Self Care | Payer: Self-pay | Source: Ambulatory Visit | Attending: Oncology | Admitting: Oncology

## 2023-07-15 ENCOUNTER — Other Ambulatory Visit: Payer: Self-pay | Admitting: Nurse Practitioner

## 2023-07-15 DIAGNOSIS — F4323 Adjustment disorder with mixed anxiety and depressed mood: Secondary | ICD-10-CM

## 2023-07-15 DIAGNOSIS — R454 Irritability and anger: Secondary | ICD-10-CM

## 2023-07-16 ENCOUNTER — Telehealth: Admitting: Nurse Practitioner

## 2023-07-16 VITALS — Ht 72.0 in | Wt 194.0 lb

## 2023-07-16 DIAGNOSIS — F1010 Alcohol abuse, uncomplicated: Secondary | ICD-10-CM | POA: Diagnosis not present

## 2023-07-16 DIAGNOSIS — G4700 Insomnia, unspecified: Secondary | ICD-10-CM

## 2023-07-16 DIAGNOSIS — F4323 Adjustment disorder with mixed anxiety and depressed mood: Secondary | ICD-10-CM | POA: Diagnosis not present

## 2023-07-16 DIAGNOSIS — N522 Drug-induced erectile dysfunction: Secondary | ICD-10-CM

## 2023-07-16 DIAGNOSIS — N529 Male erectile dysfunction, unspecified: Secondary | ICD-10-CM

## 2023-07-16 DIAGNOSIS — R7989 Other specified abnormal findings of blood chemistry: Secondary | ICD-10-CM

## 2023-07-16 MED ORDER — SILDENAFIL CITRATE 100 MG PO TABS: 50.0000 mg | ORAL_TABLET | Freq: Every day | ORAL | 2 refills | Status: DC | PRN

## 2023-07-16 MED ORDER — FLUOXETINE HCL 20 MG PO TABS: 20.0000 mg | ORAL_TABLET | Freq: Every day | ORAL | 1 refills | Status: DC

## 2023-07-16 NOTE — Progress Notes (Signed)
 MyChart Video Visit    Virtual Visit via Video Note   This visit type was conducted because this format is felt to be most appropriate for this patient at this time. Physical exam was limited by quality of the video and audio technology used for the visit. CMA was able to get the patient set up on a video visit.  Patient location: Home. Patient and provider in visit Provider location: Office  I discussed the limitations of evaluation and management by telemedicine and the availability of in person appointments. The patient expressed understanding and agreed to proceed.  Visit Date: 07/16/2023  Today's healthcare provider: Bluford Burkitt, NP     Subjective:    Patient ID: Nathan Fitzgerald, male    DOB: 1976/01/29, 48 y.o.   MRN: [id number]  Chief Complaint  Patient presents with   Anxiety    HPI  Discussed the use of AI scribe software for clinical note transcription with the patient, who gave verbal consent to proceed.  History of Present Illness   Nathan Fitzgerald "Nathan Fitzgerald" is a 48 year old male with alcohol use disorder and depression who presents for a four-week follow-up after starting Prozac  and achieving sobriety.  He has been sober for three weeks, leading to improvements in mood and sleep. He feels more positive and emotionally stable, noting that he is 'not letting things bother me as bad.' He has successfully navigated social situations where alcohol was present without relapsing.  He was switched from Zoloft  to Prozac  during his last visit and reports that it is going well, contributing to his emotional stability. He feels that his mood has improved significantly since the change in medication.  He mentions a significant decrease in sex drive, describing his sex life as 'in the dirt' and lacking interest. He attributes this partly to the Prozac , as he has a friend who experienced similar issues. He has a history of low testosterone  and has experienced erectile dysfunction  in the past. He has not been in contact with urology recently but acknowledges the need to address this issue. He has been taking someone else's 20 mg Sildenafil . He notes no improvement even with taking 2 tablets.   Past Medical History:  Diagnosis Date   Anxiety    Atypical nevus 10/14/2021   LLQA lateral above the waistline   Moderate recheck at follow up   Chicken pox    Dysplastic nevus 09/29/2021   right UQA periumbilical lat - moderate to severe, Excised 10/14/21  +margins   Heart murmur    slight   Hyperlipidemia    Hypertension    [facility] spotted fever)    as kid- also had several tic borne infections over the years    Past Surgical History:  Procedure Laterality Date   TYMPANOSTOMY TUBE PLACEMENT     as kid    Family History  Adopted: Yes  Family history unknown: Yes    Social History   Socioeconomic History   Marital status: Married    Spouse name: Not on file   Number of children: Not on file   Years of education: Not on file   Highest education level: Bachelor's degree (e.g., BA, AB, BS)  Occupational History   Not on file  Tobacco Use   Smoking status: Former   Smokeless tobacco: Never  Vaping Use   Vaping status: Never Used  Substance and Sexual Activity   Alcohol use: Yes    Alcohol/week: 5.0 standard drinks of  alcohol    Types: 5 Standard drinks or equivalent per week    Comment: social   Drug use: Not Currently   Sexual activity: Yes    Partners: Male  Other Topics Concern   Not on file  Social History Narrative   Partner Nathan Fitzgerald    Works mailman    Former smoker quit in 2007 smoked x 10 years 1 ppd    Social Drivers of Corporate investment banker Strain: Low Risk  (03/10/2023)   Overall Financial Resource Strain (CARDIA)    Difficulty of Paying Living Expenses: Not hard at all  Food Insecurity: No Food Insecurity (03/10/2023)   Hunger Vital Sign    Worried About Running Out of Food in the Last Year: Never true     Ran Out of Food in the Last Year: Never true  Transportation Needs: No Transportation Needs (03/10/2023)   PRAPARE - Administrator, Civil Service (Medical): No    Lack of Transportation (Non-Medical): No  Physical Activity: Insufficiently Active (03/10/2023)   Exercise Vital Sign    Days of Exercise per Week: 2 days    Minutes of Exercise per Session: 20 min  Stress: Stress Concern Present (03/10/2023)   [employer] of Occupational Health - Occupational Stress Questionnaire    Feeling of Stress : To some extent  Social Connections: Moderately Isolated (03/10/2023)   Social Connection and Isolation Panel [NHANES]    Frequency of Communication with Friends and Family: More than three times a week    Frequency of Social Gatherings with Friends and Family: Twice a week    Attends Religious Services: Never    Database administrator or Organizations: No    Attends Engineer, structural: Not on file    Marital Status: Married  Catering manager Violence: Not on file    Outpatient Medications Prior to Visit  Medication Sig Dispense Refill   amLODipine  (NORVASC ) 5 MG tablet TAKE ONE TABLET BY MOUTH ONCE A DAY 90 tablet 3   atorvastatin  (LIPITOR) 10 MG tablet Take 1 tablet (10 mg total) by mouth daily. After 6 pm 90 tablet 3   cyanocobalamin  (VITAMIN B12) 1000 MCG tablet Take 1,000 mcg by mouth daily.     emtricitabine-tenofovir AF (DESCOVY ) 200-25 MG tablet Take 1 tablet by mouth daily. 30 tablet 11   levocetirizine (XYZAL ) 5 MG tablet Take 1 tablet (5 mg total) by mouth every evening. 90 tablet 0   losartan -hydrochlorothiazide (HYZAAR) 100-12.5 MG tablet Take 1 tablet by mouth daily. 90 tablet 0   Multiple Vitamins-Minerals (CENTRUM SILVER 50+MEN PO) Take by mouth daily at 12 noon.     traZODone  (DESYREL ) 100 MG tablet Take 1.5-2 tablets (150-200 mg total) by mouth at bedtime. 180 tablet 1   FLUoxetine  (PROZAC ) 20 MG tablet Take 1 tablet (20 mg total) by mouth daily. 90  tablet 0   No facility-administered medications prior to visit.    No Known Allergies  ROS See HPI    Objective:     Physical Exam  Ht 6' (1.829 m)   Wt 194 lb (88 kg)   BMI 26.31 kg/m  Wt Readings from Last 3 Encounters:  07/16/23 194 lb (88 kg)  06/17/23 194 lb (88 kg)  04/22/23 192 lb 12.8 oz (87.5 kg)   GENERAL: alert, oriented, appears well and in no acute distress   HEENT: atraumatic, conjunttiva clear, no obvious abnormalities on inspection of external nose and ears   NECK: normal  movements of the head and neck   LUNGS: on inspection no signs of respiratory distress, breathing rate appears normal, no obvious gross SOB, gasping or wheezing   CV: no obvious cyanosis   MS: moves all visible extremities without noticeable abnormality   PSYCH/NEURO: pleasant and cooperative, no obvious depression or anxiety, speech and thought processing grossly intact    Assessment & Plan:   Problem List Items Addressed This Visit     Adjustment disorder with mixed anxiety and depressed mood - Primary   His depression is improving with Prozac , and he reports emotional stability and wishes to continue the medication. Continue Prozac  20 mg daily. Refills sent.       Relevant Medications   FLUoxetine  (PROZAC ) 20 MG tablet   Alcohol abuse   He has been in remission from alcohol dependence for three weeks, with improved mood and sleep, and is abstaining from alcohol. He benefits from strong family support and experiences no significant cravings. Continue his current support system and self-management strategies. Encourage reaching out to support resources if needed.      Low testosterone  in male   Consistently low testosterone  levels contribute to erectile dysfunction and decreased libido. Discussed testosterone  replacement therapy options. Encouraged to reschedule missed Urology appointment for evaluation and potential testosterone  replacement therapy      Erectile dysfunction    Erectile dysfunction is likely due to low testosterone  and Prozac . He has difficulty attaining erections but maintains them once attained. Discussed Viagra  use. Refer to urology for evaluation and management of erectile dysfunction and low testosterone . Prescribe Viagra  100 mg tablets, starting with half a tablet, and caution about potential side effects.      Relevant Medications   sildenafil  (VIAGRA ) 100 MG tablet    I am having Elgie Grist. Conran [employer]" start on sildenafil . I am also having him maintain his Multiple Vitamins-Minerals (CENTRUM SILVER 50+MEN PO), atorvastatin , levocetirizine, Descovy , amLODipine , cyanocobalamin , losartan -hydrochlorothiazide, traZODone , and FLUoxetine .  Meds ordered this encounter  Medications   sildenafil  (VIAGRA ) 100 MG tablet    Sig: Take 0.5-1 tablets (50-100 mg total) by mouth daily as needed for erectile dysfunction.    Dispense:  10 tablet    Refill:  2    Supervising Provider:   TULLO, TERESA L [[id number]]   FLUoxetine  (PROZAC ) 20 MG tablet    Sig: Take 1 tablet (20 mg total) by mouth daily.    Dispense:  90 tablet    Refill:  1    Supervising Provider:   Thersia Flax [[id number]]    I discussed the assessment and treatment plan with the patient. The patient was provided an opportunity to ask questions and all were answered. The patient agreed with the plan and demonstrated an understanding of the instructions.   The patient was advised to call back or seek an in-person evaluation if the symptoms worsen or if the condition fails to improve as anticipated.   Bluford Burkitt, NP [facility] [place] [phone] (phone) [phone] (fax)  [facility]

## 2023-07-21 ENCOUNTER — Encounter: Payer: Self-pay | Admitting: Nurse Practitioner

## 2023-07-21 DIAGNOSIS — N529 Male erectile dysfunction, unspecified: Secondary | ICD-10-CM | POA: Insufficient documentation

## 2023-07-21 NOTE — Assessment & Plan Note (Signed)
 He has been in remission from alcohol dependence for three weeks, with improved mood and sleep, and is abstaining from alcohol. He benefits from strong family support and experiences no significant cravings. Continue his current support system and self-management strategies. Encourage reaching out to support resources if needed.

## 2023-07-21 NOTE — Assessment & Plan Note (Signed)
 His depression is improving with Prozac , and he reports emotional stability and wishes to continue the medication. Continue Prozac  20 mg daily. Refills sent.

## 2023-07-21 NOTE — Assessment & Plan Note (Signed)
 Erectile dysfunction is likely due to low testosterone  and Prozac . He has difficulty attaining erections but maintains them once attained. Discussed Viagra  use. Refer to urology for evaluation and management of erectile dysfunction and low testosterone . Prescribe Viagra  100 mg tablets, starting with half a tablet, and caution about potential side effects.

## 2023-07-21 NOTE — Assessment & Plan Note (Signed)
 Consistently low testosterone  levels contribute to erectile dysfunction and decreased libido. Discussed testosterone  replacement therapy options. Encouraged to reschedule missed Urology appointment for evaluation and potential testosterone  replacement therapy

## 2023-07-23 ENCOUNTER — Encounter: Payer: Self-pay | Admitting: Nurse Practitioner

## 2023-07-23 LAB — GENECONNECT MOLECULAR SCREEN: Genetic Analysis Overall Interpretation: NEGATIVE

## 2023-08-02 ENCOUNTER — Other Ambulatory Visit: Payer: Self-pay | Admitting: Nurse Practitioner

## 2023-08-02 MED ORDER — DOXYCYCLINE HYCLATE 100 MG PO TABS: 200.0000 mg | ORAL_TABLET | ORAL | 0 refills | Status: DC | PRN

## 2023-08-18 ENCOUNTER — Other Ambulatory Visit: Payer: Self-pay | Admitting: Nurse Practitioner

## 2023-08-18 ENCOUNTER — Ambulatory Visit: Admitting: Urology

## 2023-08-18 VITALS — BP 127/73 | HR 70 | Ht 72.0 in | Wt 195.0 lb

## 2023-08-18 DIAGNOSIS — E291 Testicular hypofunction: Secondary | ICD-10-CM | POA: Diagnosis not present

## 2023-08-18 DIAGNOSIS — I10 Essential (primary) hypertension: Secondary | ICD-10-CM

## 2023-08-19 ENCOUNTER — Encounter: Payer: Self-pay | Admitting: Urology

## 2023-08-19 MED ORDER — XYOSTED 75 MG/0.5ML ~~LOC~~ SOAJ: 75.0000 mg | SUBCUTANEOUS | 3 refills | Status: DC

## 2023-08-19 NOTE — Progress Notes (Signed)
 08/18/2023 7:52 AM   Lynwood GORMAN Blush 1976/02/09 [id number]  Referring provider: Gretel App, NP [address],  [place] [address]  Chief Complaint  Patient presents with   Hypogonadism    HPI: Nathan Fitzgerald is a 48 y.o. male presents for follow-up visit.  Saw Dr. Penne 06/04/2021 for hypogonadism with symptoms of decreased libido, tiredness and fatigue Testosterone  level checked by PCP was 267.  A follow-up testosterone  level was 318 with a low free testosterone  level.  Estradiol  was normal and LH low normal at 3.6.  Prolactin level was normal. He never started on replacement therapy.  A repeat testosterone  levels ordered by PCP 03/17/2022 and February 2025 were 234 and 256 respectively. He presents today to start TRT   PMH: Past Medical History:  Diagnosis Date   Anxiety    Atypical nevus 10/14/2021   LLQA lateral above the waistline   Moderate recheck at follow up   Chicken pox    Dysplastic nevus 09/29/2021   right UQA periumbilical lat - moderate to severe, Excised 10/14/21  +margins   Heart murmur    slight   Hyperlipidemia    Hypertension    RMSF [facility] spotted fever)    as kid- also had several tic borne infections over the years    Surgical History: Past Surgical History:  Procedure Laterality Date   TYMPANOSTOMY TUBE PLACEMENT     as kid    Home Medications:  Allergies as of 08/18/2023   No Known Allergies      Medication List        Accurate as of August 18, 2023 11:59 PM. If you have any questions, ask your nurse or doctor.          amLODipine  5 MG tablet Commonly known as: NORVASC  TAKE ONE TABLET BY MOUTH ONCE A DAY   atorvastatin  10 MG tablet Commonly known as: LIPITOR Take 1 tablet (10 mg total) by mouth daily. After 6 pm   CENTRUM SILVER 50+MEN PO Take by mouth daily at 12 noon.   cyanocobalamin  1000 MCG tablet Commonly known as: VITAMIN B12 Take 1,000 mcg by mouth daily.   Descovy  200-25 MG  tablet Generic drug: emtricitabine-tenofovir AF Take 1 tablet by mouth daily.   doxycycline  100 MG tablet Commonly known as: VIBRA -TABS Take 2 tablets (200 mg total) by mouth as needed. Within 72 hours of possible exposure.   FLUoxetine  20 MG tablet Commonly known as: PROZAC  Take 1 tablet (20 mg total) by mouth daily.   levocetirizine 5 MG tablet Commonly known as: XYZAL  Take 1 tablet (5 mg total) by mouth every evening.   losartan -hydrochlorothiazide 100-12.5 MG tablet Commonly known as: HYZAAR TAKE ONE TABLET BY MOUTH DAILY.   sildenafil  100 MG tablet Commonly known as: Viagra  Take 0.5-1 tablets (50-100 mg total) by mouth daily as needed for erectile dysfunction.   traZODone  100 MG tablet Commonly known as: DESYREL  Take 1.5-2 tablets (150-200 mg total) by mouth at bedtime.        Allergies: No Known Allergies  Family History: Family History  Adopted: Yes  Family history unknown: Yes    Social History:  reports that he has quit smoking. He has never used smokeless tobacco. He reports current alcohol use of about 5.0 standard drinks of alcohol per week. He reports that he does not currently use drugs.   Physical Exam: BP 127/73   Pulse 70   Ht 6' (1.829 m)   Wt 195 lb (88.5  kg)   BMI 26.45 kg/m   Constitutional:  Alert and oriented, No acute distress. HEENT: [place] AT Respiratory: Normal respiratory effort, no increased work of breathing. Psychiatric: Normal mood and affect.   Assessment & Plan:    1.  Hypogonadism Symptoms decreased libido, tiredness/fatigue We discussed various forms of testosterone  placement including topical preparations, intramuscular injections, subcutaneous injections, subcutaneous pellet implantation and oral testosterone .  Pros and cons of each form were discussed.  He would also be a candidate for the off label use of Clomid  I had an extensive discussion regarding testosterone  replacement therapy including the following: Treatment  may result in improvements in erectile function, low sex drive, anemia, bone mineral density, lean body mass, and depressive symptoms; evidence is inconclusive whether testosterone  therapy improves cognitive function, measures of diabetes, energy, fatigue, lipid profiles, and quality of life measures; there is no conclusive evidence linking testosterone  therapy to the development of prostate cancer; there is no definitive evidence linking testosterone  therapy to a higher incidence of venothrombolic events; at the present time it cannot be stated definitively whether testosterone  therapy increases or decreases the risk of cardiovascular events including myocardial infarction and stroke. Potential side effects were discussed including erythrocytosis, gynecomastia.  The need for regular monitoring of testosterone  levels and hematocrit was discussed. He is interested in subcutaneous testosterone  injection/Xyosted.  Rx sent to [facility] If approved will obtain a follow-up testosterone  level in 6 weeks   Glendia JAYSON Barba, MD  [facility] [address] [place], [place] [address] [phone]

## 2023-08-31 ENCOUNTER — Telehealth: Payer: Self-pay | Admitting: *Deleted

## 2023-08-31 NOTE — Telephone Encounter (Signed)
 Nathan Fitzgerald  was denied , Letter states testosterone  gel or IM injections.

## 2023-09-01 MED ORDER — TESTOSTERONE CYPIONATE 200 MG/ML IM SOLN: 200.0000 mg | INTRAMUSCULAR | 0 refills | Status: DC

## 2023-09-01 NOTE — Addendum Note (Signed)
 Addended by: TWYLLA GLENDIA BROCKS on: 09/01/2023 12:24 PM   Modules accepted: Orders

## 2023-09-03 ENCOUNTER — Other Ambulatory Visit: Payer: Self-pay | Admitting: Nurse Practitioner

## 2023-09-28 NOTE — Progress Notes (Signed)
   Urological history: 1. Hypogonadism   Nathan Fitzgerald is a 48 y.o.presents today for education on self administration of testosterone  injections.  He has been prescribed testosterone  cypionate 200 mg/milliliters for the treatment of hypogonadism.  He expresses interest in administering the injections at home and has had no prior experience with intramuscular or subcutaneous injections.  He is motivated to learn and asks appropriate questions.  Physical exam: Vitals:   09/29/23 1511  BP: 123/72  Pulse: 75  SpO2: 98%   Constitutional:  Well nourished. Alert and oriented, No acute distress. Psychiatric: Normal mood and affect.  No physical limitations noted that would interfere with self injections  Assessment: Hypogonadism-on testosterone  replacement therapy.  Needs instruction on safe for proper self injection technique.  Plan: - Reviewed and demonstrated the following with the patient: Hand hygiene Dried up medication with 18-gauge needle Switching to 23-gauge needle for injection Injections identification (gluteal or lateral thigh for IM: abdomen or thigh for SQ) Proper technique, angle, and depth Needle disposal sharps container Patient then performed return demonstration successfully with verbal guidance Provided written instructions and answered all questions Encouraged patient to call with any concerns or if unsure about future injections Will continue testosterone  therapy as prescribed Follow-up testosterone  level, hemoglobin and hematocrit in 9 weeks

## 2023-09-29 ENCOUNTER — Ambulatory Visit: Admitting: Urology

## 2023-09-29 ENCOUNTER — Encounter: Payer: Self-pay | Admitting: Urology

## 2023-09-29 VITALS — BP 123/72 | HR 75 | Ht 72.0 in | Wt 190.0 lb

## 2023-09-29 DIAGNOSIS — E291 Testicular hypofunction: Secondary | ICD-10-CM

## 2023-10-21 ENCOUNTER — Other Ambulatory Visit: Payer: Self-pay | Admitting: Urology

## 2023-10-23 ENCOUNTER — Other Ambulatory Visit: Payer: Self-pay | Admitting: Urology

## 2023-10-28 ENCOUNTER — Other Ambulatory Visit: Payer: Self-pay | Admitting: Nurse Practitioner

## 2023-10-28 DIAGNOSIS — N529 Male erectile dysfunction, unspecified: Secondary | ICD-10-CM

## 2023-11-06 ENCOUNTER — Other Ambulatory Visit: Payer: Self-pay | Admitting: Nurse Practitioner

## 2023-11-06 DIAGNOSIS — I10 Essential (primary) hypertension: Secondary | ICD-10-CM

## 2023-11-17 ENCOUNTER — Other Ambulatory Visit

## 2023-11-17 DIAGNOSIS — E291 Testicular hypofunction: Secondary | ICD-10-CM

## 2023-11-18 ENCOUNTER — Ambulatory Visit: Payer: Self-pay | Admitting: Urology

## 2023-11-18 LAB — HEMOGLOBIN AND HEMATOCRIT, BLOOD
Hematocrit: 45.6 % (ref 37.5–51.0)
Hemoglobin: 15.1 g/dL (ref 13.0–17.7)

## 2023-11-18 LAB — TESTOSTERONE: Testosterone: 581 ng/dL (ref 264–916)

## 2023-12-06 ENCOUNTER — Other Ambulatory Visit: Payer: Self-pay | Admitting: Nurse Practitioner

## 2023-12-06 DIAGNOSIS — G4700 Insomnia, unspecified: Secondary | ICD-10-CM

## 2023-12-07 ENCOUNTER — Ambulatory Visit: Admitting: Dermatology

## 2023-12-26 ENCOUNTER — Encounter

## 2024-01-03 ENCOUNTER — Other Ambulatory Visit: Payer: Self-pay | Admitting: Urology

## 2024-01-04 ENCOUNTER — Encounter: Payer: Self-pay | Admitting: *Deleted

## 2024-01-20 ENCOUNTER — Ambulatory Visit: Admitting: Nurse Practitioner

## 2024-01-20 ENCOUNTER — Encounter: Payer: Self-pay | Admitting: Nurse Practitioner

## 2024-01-20 VITALS — BP 116/73 | HR 60 | Temp 98.5°F | Ht 72.0 in | Wt 197.6 lb

## 2024-01-20 DIAGNOSIS — Z79899 Other long term (current) drug therapy: Secondary | ICD-10-CM

## 2024-01-20 DIAGNOSIS — E785 Hyperlipidemia, unspecified: Secondary | ICD-10-CM | POA: Diagnosis not present

## 2024-01-20 DIAGNOSIS — R454 Irritability and anger: Secondary | ICD-10-CM | POA: Diagnosis not present

## 2024-01-20 DIAGNOSIS — F4323 Adjustment disorder with mixed anxiety and depressed mood: Secondary | ICD-10-CM | POA: Diagnosis not present

## 2024-01-20 DIAGNOSIS — I10 Essential (primary) hypertension: Secondary | ICD-10-CM | POA: Diagnosis not present

## 2024-01-20 DIAGNOSIS — G4700 Insomnia, unspecified: Secondary | ICD-10-CM | POA: Diagnosis not present

## 2024-01-20 DIAGNOSIS — R7989 Other specified abnormal findings of blood chemistry: Secondary | ICD-10-CM | POA: Diagnosis not present

## 2024-01-20 DIAGNOSIS — N529 Male erectile dysfunction, unspecified: Secondary | ICD-10-CM

## 2024-01-20 LAB — CBC WITH DIFFERENTIAL/PLATELET
Basophils Absolute: 0.1 K/uL (ref 0.0–0.1)
Basophils Relative: 1 % (ref 0.0–3.0)
Eosinophils Absolute: 0.3 K/uL (ref 0.0–0.7)
Eosinophils Relative: 5.9 % — ABNORMAL HIGH (ref 0.0–5.0)
HCT: 48.6 % (ref 39.0–52.0)
Hemoglobin: 16.6 g/dL (ref 13.0–17.0)
Lymphocytes Relative: 32.6 % (ref 12.0–46.0)
Lymphs Abs: 1.9 K/uL (ref 0.7–4.0)
MCHC: 34.1 g/dL (ref 30.0–36.0)
MCV: 89.3 fl (ref 78.0–100.0)
Monocytes Absolute: 0.6 K/uL (ref 0.1–1.0)
Monocytes Relative: 10.1 % (ref 3.0–12.0)
Neutro Abs: 2.9 K/uL (ref 1.4–7.7)
Neutrophils Relative %: 50.4 % (ref 43.0–77.0)
Platelets: 219 K/uL (ref 150.0–400.0)
RBC: 5.45 Mil/uL (ref 4.22–5.81)
RDW: 13.8 % (ref 11.5–15.5)
WBC: 5.8 K/uL (ref 4.0–10.5)

## 2024-01-20 LAB — COMPREHENSIVE METABOLIC PANEL WITH GFR
ALT: 18 U/L (ref 0–53)
AST: 16 U/L (ref 0–37)
Albumin: 4.5 g/dL (ref 3.5–5.2)
Alkaline Phosphatase: 70 U/L (ref 39–117)
BUN: 19 mg/dL (ref 6–23)
CO2: 28 meq/L (ref 19–32)
Calcium: 9.1 mg/dL (ref 8.4–10.5)
Chloride: 100 meq/L (ref 96–112)
Creatinine, Ser: 0.99 mg/dL (ref 0.40–1.50)
GFR: 89.87 mL/min (ref >60.00)
Glucose, Bld: 92 mg/dL (ref 70–99)
Potassium: 3.6 meq/L (ref 3.5–5.1)
Sodium: 137 meq/L (ref 135–145)
Total Bilirubin: 0.7 mg/dL (ref 0.2–1.2)
Total Protein: 7.4 g/dL (ref 6.0–8.3)

## 2024-01-20 LAB — LIPID PANEL
Cholesterol: 201 mg/dL — ABNORMAL HIGH (ref 0–200)
HDL: 33.5 mg/dL — ABNORMAL LOW (ref >39.00)
LDL Cholesterol: 153 mg/dL — ABNORMAL HIGH (ref 0–99)
NonHDL: 167.78
Total CHOL/HDL Ratio: 6
Triglycerides: 75 mg/dL (ref 0.0–149.0)
VLDL: 15 mg/dL (ref 0.0–40.0)

## 2024-01-20 LAB — TSH: TSH: 0.6 u[IU]/mL (ref 0.35–5.50)

## 2024-01-20 MED ORDER — ATORVASTATIN CALCIUM 10 MG PO TABS: 10.0000 mg | ORAL_TABLET | Freq: Every day | ORAL | 3 refills | Status: AC

## 2024-01-20 MED ORDER — FLUOXETINE HCL 40 MG PO CAPS: 40.0000 mg | ORAL_CAPSULE | Freq: Every day | ORAL | 3 refills | Status: AC

## 2024-01-20 MED ORDER — DESCOVY 200-25 MG PO TABS: 1.0000 | ORAL_TABLET | Freq: Every day | ORAL | 11 refills | Status: AC

## 2024-01-20 MED ORDER — TRAZODONE HCL 100 MG PO TABS: 150.0000 mg | ORAL_TABLET | Freq: Every day | ORAL | 3 refills | Status: AC

## 2024-01-20 MED ORDER — LOSARTAN POTASSIUM-HCTZ 100-12.5 MG PO TABS: 1.0000 | ORAL_TABLET | Freq: Every day | ORAL | 3 refills | Status: AC

## 2024-01-20 MED ORDER — SILDENAFIL CITRATE 100 MG PO TABS: 50.0000 mg | ORAL_TABLET | Freq: Every day | ORAL | 5 refills | Status: AC | PRN

## 2024-01-20 NOTE — Progress Notes (Unsigned)
 Leron Glance, NP-C Phone: [phone]  Nathan Fitzgerald is a 48 y.o. male who presents today for follow up.   ***  Social History   Tobacco Use  Smoking Status Former  Smokeless Tobacco Never    Current Outpatient Medications on File Prior to Visit  Medication Sig Dispense Refill   B-D 3CC LUER-LOK SYR 23GX1 23G X 1 3 ML MISC USE TO INJECT TESTOSTERONE  EVERY TWO WEEKS 2 each 1   BD DISP NEEDLES 18G X 1-1/2 MISC USE TO INJECT TESTOSTERONE  EVERY TWO WEEKS 2 each 1   cyanocobalamin  (VITAMIN B12) 1000 MCG tablet Take 1,000 mcg by mouth daily.     doxycycline  (VIBRA -TABS) 100 MG tablet TAKE 2 TABLETS (200 MG TOTAL) BY MOUTH AS NEEDED. WITHIN 72 HOURS OF POSSIBLE EXPOSURE. 30 tablet 2   Multiple Vitamins-Minerals (CENTRUM SILVER 50+MEN PO) Take by mouth daily at 12 noon.     testosterone  cypionate (DEPOTESTOSTERONE CYPIONATE) 200 MG/ML injection INJECT ONE ML (200 MG) INTO THE MUSCLE EVERY 14 DAYS. 10 mL 0   No current facility-administered medications on file prior to visit.     ROS see history of present illness  Objective  Physical Exam Vitals:   01/20/24 0858  BP: 116/73  Pulse: 60  Temp: 98.5 F (36.9 C)  SpO2: 97%    BP Readings from Last 3 Encounters:  01/20/24 116/73  09/29/23 123/72  08/18/23 127/73   Wt Readings from Last 3 Encounters:  01/20/24 197 lb 9.6 oz (89.6 kg)  09/29/23 190 lb (86.2 kg)  08/18/23 195 lb (88.5 kg)    Physical Exam Constitutional:      General: He is not in acute distress.    Appearance: Normal appearance.  HENT:     Head: Normocephalic.  Cardiovascular:     Rate and Rhythm: Normal rate and regular rhythm.     Heart sounds: Murmur heard.  Pulmonary:     Effort: Pulmonary effort is normal.     Breath sounds: Normal breath sounds.  Skin:    General: Skin is warm and dry.  Neurological:     General: No focal deficit present.     Mental Status: He is alert.  Psychiatric:        Mood and Affect: Mood normal.         Behavior: Behavior normal.      Assessment/Plan: Please see individual problem list.  Adjustment disorder with mixed anxiety and depressed mood -     FLUoxetine  HCl; Take 1 capsule (40 mg total) by mouth daily.  Dispense: 90 capsule; Refill: 3 -     TSH  Irritability and anger -     FLUoxetine  HCl; Take 1 capsule (40 mg total) by mouth daily.  Dispense: 90 capsule; Refill: 3  Primary hypertension -     Losartan  Potassium-HCTZ; Take 1 tablet by mouth daily.  Dispense: 90 tablet; Refill: 3 -     CBC with Differential/Platelet -     Comprehensive metabolic panel with GFR  Low testosterone  in male  Hyperlipidemia, unspecified hyperlipidemia type -     Atorvastatin  Calcium ; Take 1 tablet (10 mg total) by mouth daily. After 6 pm  Dispense: 90 tablet; Refill: 3 -     Lipid panel  Insomnia, unspecified type -     traZODone  HCl; Take 1.5-2 tablets (150-200 mg total) by mouth at bedtime.  Dispense: 180 tablet; Refill: 3  On pre-exposure prophylaxis for HIV -     Descovy ; Take 1 tablet by mouth  daily.  Dispense: 30 tablet; Refill: 11 -     HIV Antibody (routine testing w rflx)  Erectile dysfunction, unspecified erectile dysfunction type -     Sildenafil  Citrate; Take 0.5-1 tablets (50-100 mg total) by mouth daily as needed for erectile dysfunction.  Dispense: 10 tablet; Refill: 5      Return in about 6 months (around 07/20/2024) for Follow up.   Leron Glance, NP-C [facility]

## 2024-01-21 LAB — HIV ANTIBODY (ROUTINE TESTING W REFLEX)
HIV 1&2 Ab, 4th Generation: NONREACTIVE
HIV FINAL INTERPRETATION: NEGATIVE

## 2024-02-08 ENCOUNTER — Ambulatory Visit: Payer: Self-pay | Admitting: Nurse Practitioner

## 2024-02-08 ENCOUNTER — Encounter: Payer: Self-pay | Admitting: Nurse Practitioner

## 2024-02-08 NOTE — Assessment & Plan Note (Addendum)
 Blood pressure is well-controlled at 116/73 mmHg. Amlodipine  use has been inconsistent. Discontinued amlodipine  and continued losartan /hydrochlorothiazide 100-12.5 mg daily. We will continue to monitor.

## 2024-02-08 NOTE — Assessment & Plan Note (Signed)
 Managed by urology. Experiencing soreness and swelling at the injection site. Considering switching injections to the gluteal site for better tolerance. Follow up with urology as scheduled.

## 2024-02-08 NOTE — Assessment & Plan Note (Signed)
 Mood remains flat with increased irritability and no significant improvement on 20 mg Prozac . Increased Prozac  to 40 mg daily. Counseled patient on common side effects. Encouraged to contact if worsening symptoms, unusual behavior changes or suicidal thoughts occur. Follow up in two weeks to assess response.

## 2024-02-08 NOTE — Assessment & Plan Note (Signed)
 Not taking atorvastatin . Emphasized its importance for cholesterol management and advised to resume atorvastatin . Check lipid panel.

## 2024-02-08 NOTE — Assessment & Plan Note (Signed)
 See plan for anx/dep. Increasing Prozac  to 40 mg daily.

## 2024-02-08 NOTE — Assessment & Plan Note (Signed)
 Sleep has improved with cessation of alcohol use. Continue trazodone  as needed,

## 2024-02-08 NOTE — Assessment & Plan Note (Signed)
 Continue Descovy . Counseled on safe sex practices. Check HIV screening.

## 2024-03-13 ENCOUNTER — Other Ambulatory Visit: Payer: Self-pay | Admitting: Nurse Practitioner

## 2024-03-13 DIAGNOSIS — F4323 Adjustment disorder with mixed anxiety and depressed mood: Secondary | ICD-10-CM

## 2024-03-15 ENCOUNTER — Other Ambulatory Visit: Payer: Self-pay | Admitting: Nurse Practitioner

## 2024-05-03 ENCOUNTER — Other Ambulatory Visit

## 2024-05-05 ENCOUNTER — Ambulatory Visit: Admitting: Urology

## 2024-07-20 ENCOUNTER — Ambulatory Visit: Admitting: Nurse Practitioner
